# Patient Record
Sex: Female | Born: 1950 | Race: White | Hispanic: No | Marital: Married | Smoking: Never smoker
Health system: Southern US, Community
[De-identification: ages and names within clinical notes are randomized; demographics above are authoritative.]

## PROBLEM LIST (undated history)

## (undated) DIAGNOSIS — M1990 Unspecified osteoarthritis, unspecified site: Secondary | ICD-10-CM

## (undated) DIAGNOSIS — F419 Anxiety disorder, unspecified: Secondary | ICD-10-CM

## (undated) DIAGNOSIS — H04129 Dry eye syndrome of unspecified lacrimal gland: Secondary | ICD-10-CM

## (undated) DIAGNOSIS — K760 Fatty (change of) liver, not elsewhere classified: Secondary | ICD-10-CM

## (undated) DIAGNOSIS — E559 Vitamin D deficiency, unspecified: Secondary | ICD-10-CM

## (undated) DIAGNOSIS — F410 Panic disorder [episodic paroxysmal anxiety] without agoraphobia: Secondary | ICD-10-CM

## (undated) DIAGNOSIS — K649 Unspecified hemorrhoids: Secondary | ICD-10-CM

## (undated) DIAGNOSIS — I10 Essential (primary) hypertension: Secondary | ICD-10-CM

## (undated) DIAGNOSIS — M8580 Other specified disorders of bone density and structure, unspecified site: Secondary | ICD-10-CM

## (undated) DIAGNOSIS — J309 Allergic rhinitis, unspecified: Secondary | ICD-10-CM

## (undated) HISTORY — DX: Dry eye syndrome of unspecified lacrimal gland: H04.129

## (undated) HISTORY — DX: Unspecified osteoarthritis, unspecified site: M19.90

## (undated) HISTORY — DX: Fatty (change of) liver, not elsewhere classified: K76.0

## (undated) HISTORY — PX: COLONOSCOPY WITH ESOPHAGOGASTRODUODENOSCOPY (EGD): SHX5779

## (undated) HISTORY — DX: Allergic rhinitis, unspecified: J30.9

## (undated) HISTORY — DX: Unspecified hemorrhoids: K64.9

## (undated) HISTORY — DX: Vitamin D deficiency, unspecified: E55.9

## (undated) HISTORY — PX: ROTATOR CUFF REPAIR: SHX139

## (undated) HISTORY — PX: OTHER SURGICAL HISTORY: SHX169

## (undated) HISTORY — DX: Anxiety disorder, unspecified: F41.9

## (undated) HISTORY — PX: TONSILLECTOMY: SUR1361

---

## 1999-01-22 ENCOUNTER — Other Ambulatory Visit: Admission: RE | Admit: 1999-01-22 | Discharge: 1999-01-22 | Payer: Self-pay | Admitting: Obstetrics and Gynecology

## 2000-08-10 ENCOUNTER — Other Ambulatory Visit: Admission: RE | Admit: 2000-08-10 | Discharge: 2000-08-10 | Payer: Self-pay | Admitting: Obstetrics and Gynecology

## 2001-10-25 ENCOUNTER — Other Ambulatory Visit: Admission: RE | Admit: 2001-10-25 | Discharge: 2001-10-25 | Payer: Self-pay | Admitting: Obstetrics and Gynecology

## 2002-12-26 ENCOUNTER — Other Ambulatory Visit: Admission: RE | Admit: 2002-12-26 | Discharge: 2002-12-26 | Payer: Self-pay | Admitting: Obstetrics and Gynecology

## 2004-01-15 ENCOUNTER — Other Ambulatory Visit: Admission: RE | Admit: 2004-01-15 | Discharge: 2004-01-15 | Payer: Self-pay | Admitting: Obstetrics and Gynecology

## 2005-04-06 ENCOUNTER — Other Ambulatory Visit: Admission: RE | Admit: 2005-04-06 | Discharge: 2005-04-06 | Payer: Self-pay | Admitting: Obstetrics and Gynecology

## 2005-06-23 ENCOUNTER — Other Ambulatory Visit: Admission: RE | Admit: 2005-06-23 | Discharge: 2005-06-23 | Payer: Self-pay | Admitting: Obstetrics and Gynecology

## 2006-09-06 HISTORY — PX: CHOLECYSTECTOMY: SHX55

## 2006-09-06 HISTORY — PX: TOTAL ABDOMINAL HYSTERECTOMY W/ BILATERAL SALPINGOOPHORECTOMY: SHX83

## 2006-11-18 ENCOUNTER — Encounter: Admission: RE | Admit: 2006-11-18 | Discharge: 2006-11-18 | Payer: Self-pay | Admitting: Gastroenterology

## 2006-12-01 ENCOUNTER — Ambulatory Visit (HOSPITAL_COMMUNITY): Admission: RE | Admit: 2006-12-01 | Discharge: 2006-12-01 | Payer: Self-pay | Admitting: Gastroenterology

## 2006-12-30 ENCOUNTER — Ambulatory Visit (HOSPITAL_COMMUNITY): Admission: RE | Admit: 2006-12-30 | Discharge: 2006-12-31 | Payer: Self-pay | Admitting: Surgery

## 2006-12-30 ENCOUNTER — Encounter (INDEPENDENT_AMBULATORY_CARE_PROVIDER_SITE_OTHER): Payer: Self-pay | Admitting: Specialist

## 2007-02-09 ENCOUNTER — Ambulatory Visit (HOSPITAL_COMMUNITY): Admission: RE | Admit: 2007-02-09 | Discharge: 2007-02-09 | Payer: Self-pay | Admitting: Obstetrics and Gynecology

## 2007-05-03 ENCOUNTER — Inpatient Hospital Stay (HOSPITAL_COMMUNITY): Admission: RE | Admit: 2007-05-03 | Discharge: 2007-05-06 | Payer: Self-pay | Admitting: Obstetrics and Gynecology

## 2007-05-03 ENCOUNTER — Encounter (INDEPENDENT_AMBULATORY_CARE_PROVIDER_SITE_OTHER): Payer: Self-pay | Admitting: Obstetrics and Gynecology

## 2007-07-01 ENCOUNTER — Encounter: Payer: Self-pay | Admitting: Pulmonary Disease

## 2007-08-07 ENCOUNTER — Ambulatory Visit: Payer: Self-pay | Admitting: Pulmonary Disease

## 2007-08-09 ENCOUNTER — Ambulatory Visit: Payer: Self-pay | Admitting: Pulmonary Disease

## 2007-08-09 ENCOUNTER — Telehealth: Payer: Self-pay | Admitting: Pulmonary Disease

## 2007-08-14 ENCOUNTER — Telehealth: Payer: Self-pay | Admitting: Pulmonary Disease

## 2008-05-09 ENCOUNTER — Ambulatory Visit (HOSPITAL_COMMUNITY): Admission: RE | Admit: 2008-05-09 | Discharge: 2008-05-09 | Payer: Self-pay | Admitting: Family Medicine

## 2008-06-03 IMAGING — US US ABDOMEN COMPLETE
1 series · 14 of 25 positions shown · non-contrast
Comparison: none

CLINICAL DATA: Abdominal pain.  
 ABDOMEN ULTRASOUND:
TECHNIQUE: Complete abdominal ultrasound examination was performed including evaluation of the liver, gallbladder, bile ducts, pancreas, kidneys, spleen, IVC, and abdominal aorta.

[Series 1: us abdomen complete · 14 of 73 slices shown]
[im 1/73]
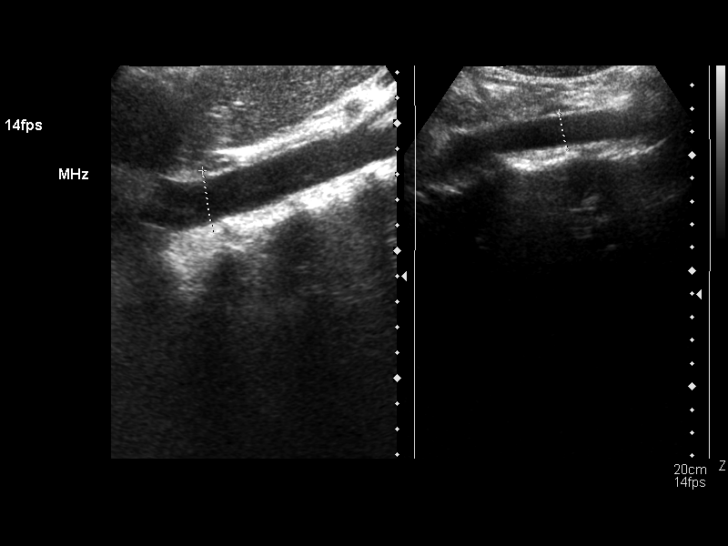
[im 7/73]
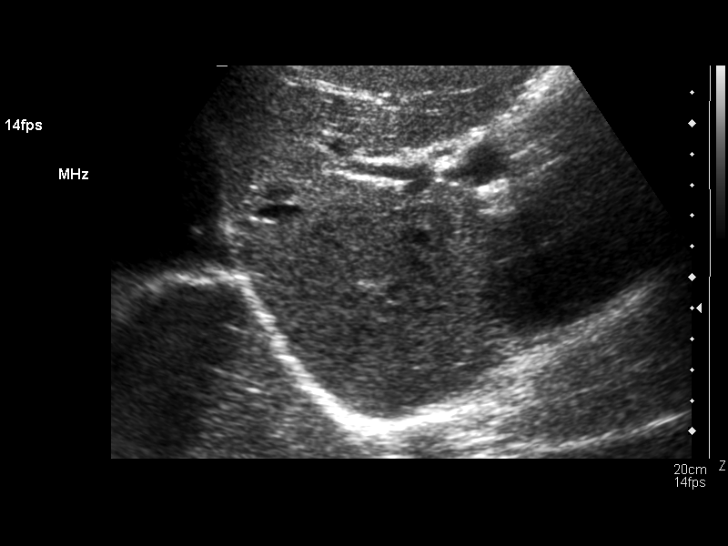
[im 13/73]
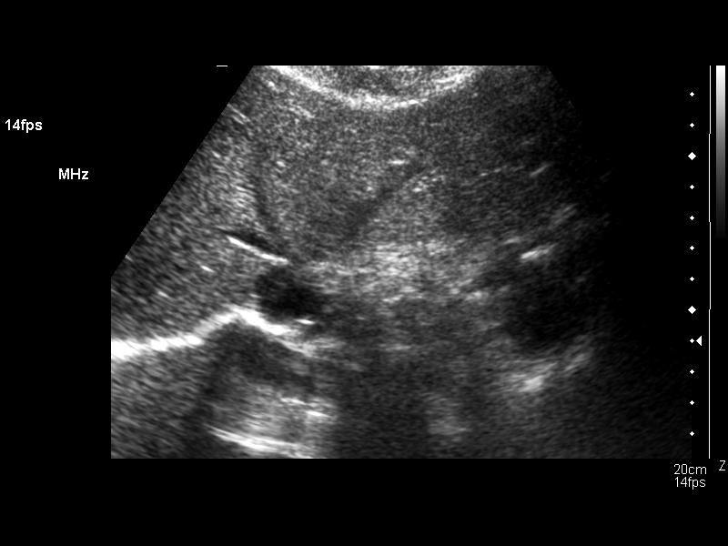
[im 19/73]
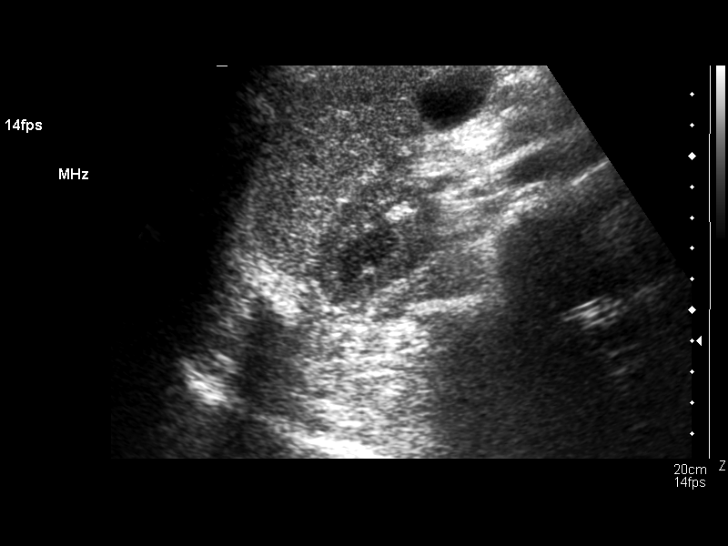
[im 25/73]
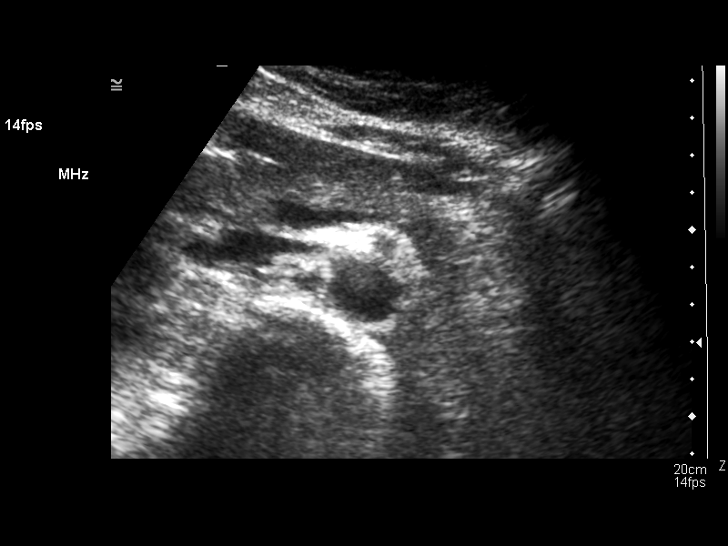
[im 28/73]
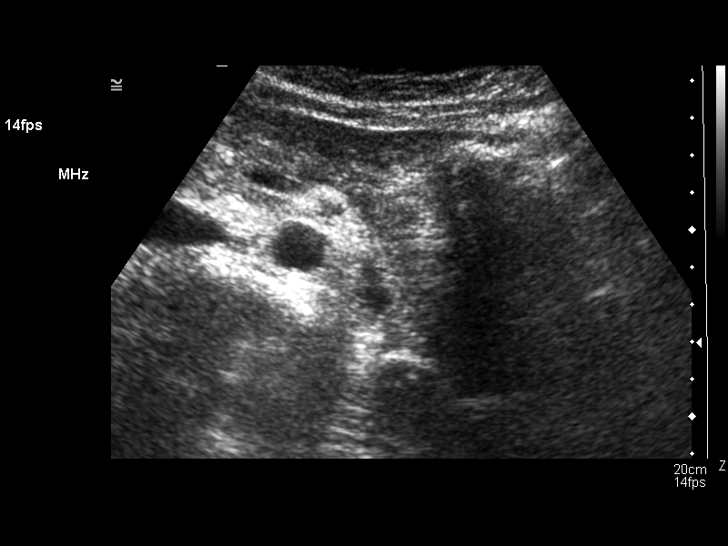
[im 34/73]
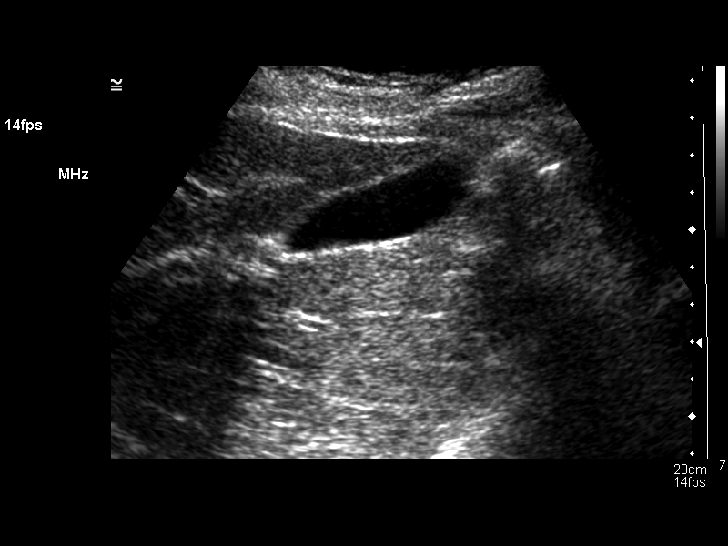
[im 40/73]
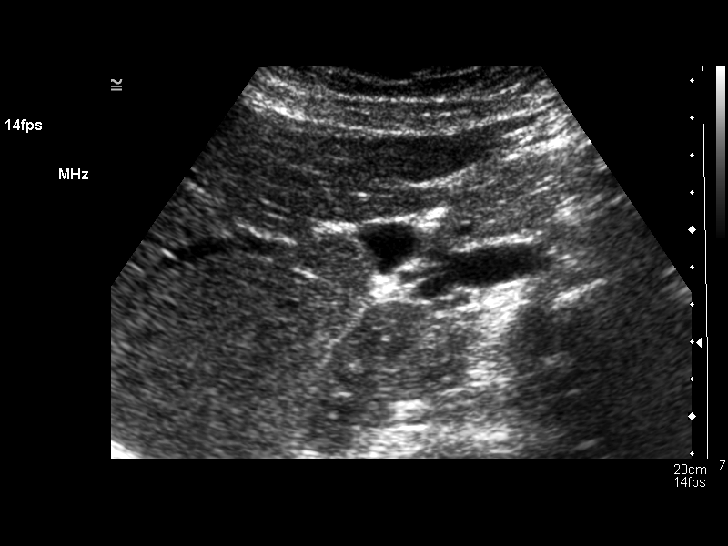
[im 46/73]
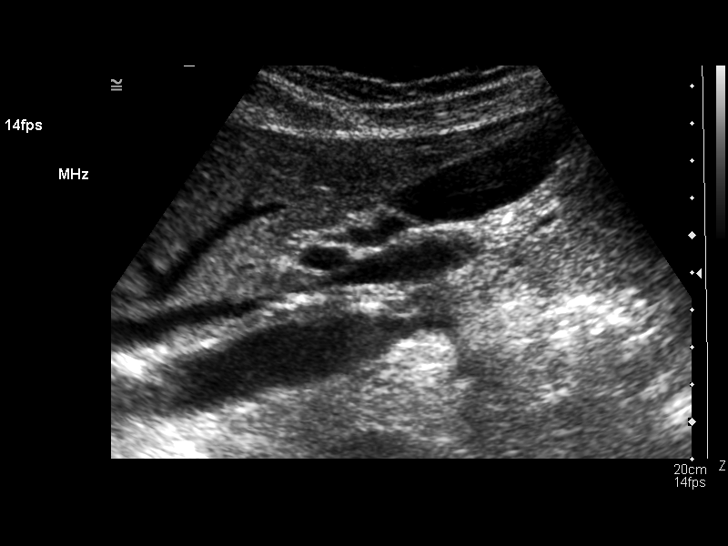
[im 49/73]
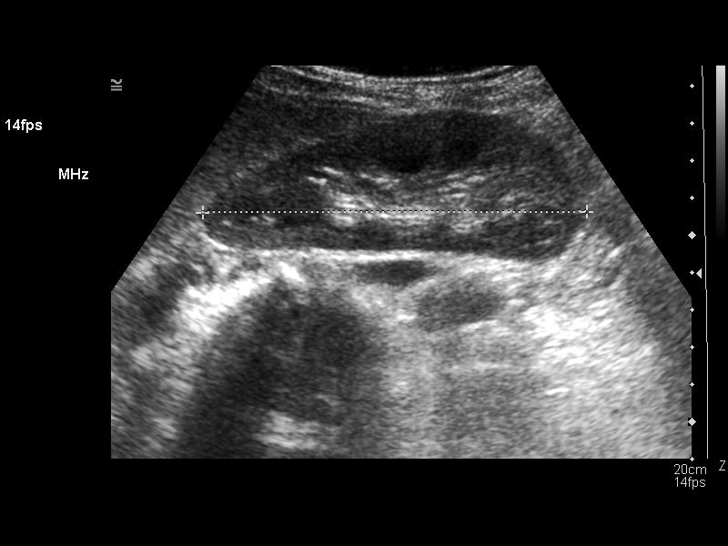
[im 55/73]
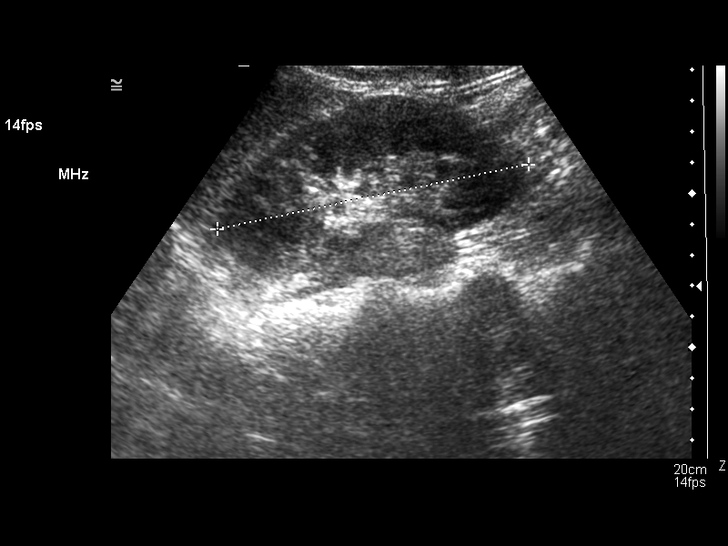
[im 61/73]
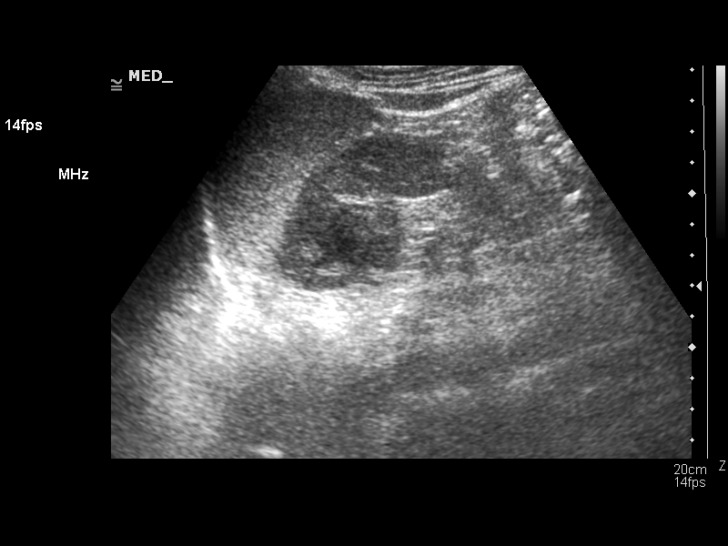
[im 67/73]
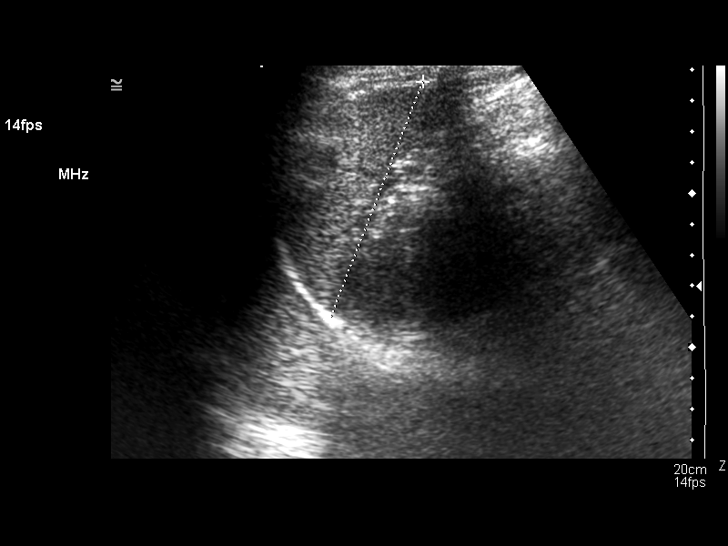
[im 73/73]
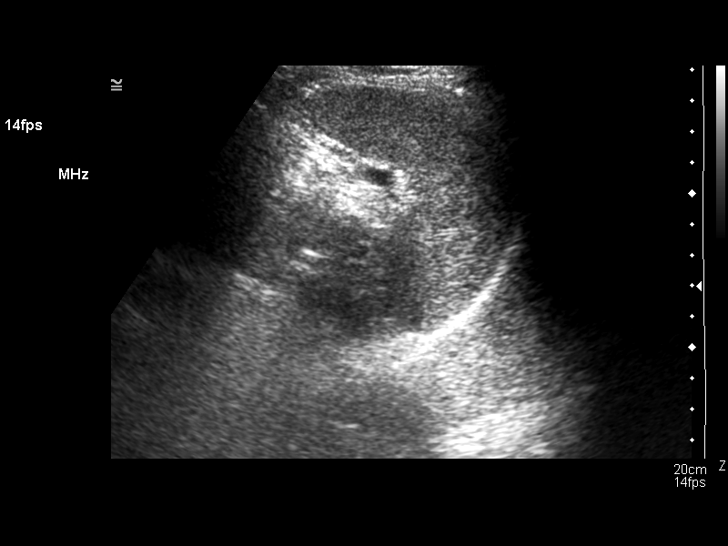

[14 of 25 positions shown; findings below may reference images not displayed]

FINDINGS: 4.7 mm echogenic focus posterior gallbladder wall, nonmobile, consistent with a small polyp.  No gallstones.   Common duct measures 2.7 mm which is well within normal limits.  No hepatic,  pancreatic, splenic, or renal abnormality.  The spleen measures 8.3 cm in length.  Right and left kidneys measure 10.4 cm and 10.5 cm in length, respectively.  Patent IVC.  Abdominal aorta maximum diameter is 2.5 cm.
IMPRESSION: Small gallbladder polyp.  Otherwise, negative ultrasound exam.

## 2009-05-30 ENCOUNTER — Ambulatory Visit (HOSPITAL_COMMUNITY): Admission: RE | Admit: 2009-05-30 | Discharge: 2009-05-30 | Payer: Self-pay | Admitting: Family Medicine

## 2010-07-06 ENCOUNTER — Ambulatory Visit (HOSPITAL_COMMUNITY): Admission: RE | Admit: 2010-07-06 | Discharge: 2010-07-06 | Payer: Self-pay | Admitting: Family Medicine

## 2010-09-27 ENCOUNTER — Encounter: Payer: Self-pay | Admitting: Gastroenterology

## 2011-01-19 NOTE — Op Note (Signed)
Jane Moody, Jane Moody              ACCOUNT NO.:  [id number]   MEDICAL RECORD NO.:  [id number]          PATIENT TYPE:  INP   LOCATION:  9307                          FACILITY:  [facility]   PHYSICIAN:  Guy Sandifer. Henderson Cloud, M.D. DATE OF BIRTH:  Oct 26, 1950   DATE OF PROCEDURE:  05/03/2007  DATE OF DISCHARGE:                               OPERATIVE REPORT   PREOPERATIVE DIAGNOSES:  1. Cervical dysplasia.  2. Agglutinated upper vagina.   POSTOPERATIVE DIAGNOSES:  1. Cervical dysplasia.  2. Agglutinated upper vagina.   PROCEDURE:  Total abdominal hysterectomy with bilateral salpingo-  oophorectomy.   SURGEON:  Guy Sandifer. Henderson Cloud, M.D.   ASSISTANTFreddy Finner, M.D.   ANESTHESIA:  General with endotracheal intubation.   SPECIMENS:  Uterus, bilateral tubes and ovaries to pathology.   ESTIMATED BLOOD LOSS:  100 mL.   INDICATIONS AND CONSENT:  This patient is a 60 year old married white  female, G2, P2, status post LEEP procedure of the cervix in December  2006.  Following that the upper vagina essentially agglutinated,  obscuring the cervix.  Attempts to reach this were successful.  Because  of the concern that the cervix could not be evaluated for recurrent  cervical dysplasia, a hysterectomy was recommended.  The patient also  desires bilateral salpingo-oophorectomy.  Potential risks and  complications are discussed preoperatively including but not limited to  infection; bowel, bladder, or ureteral damage; bleeding requiring  transfusion of blood products with possible transfusion reaction, HIV  and hepatitis acquisition;  DVT, PE, pneumonia, fistula formation,  postoperative dyspareunia and pelvic pain.  All questions were answered  and consent is signed on the chart.   FINDINGS:  Upper abdomen palpates grossly normal.  Uterus is normal  size.  Anterior and posterior cul-de-sacs are normal and tubes and  ovaries are normal bilaterally.   PROCEDURE:  The patient is taken to the  operating room, where she is  identified, placed in the dorsal supine position and general anesthesia  is induced via endotracheal intubation.  She is then prepped abdominally  and vaginally, Foley catheter is placed in the bladder to drain.  A  sponge on a stick is placed in the vagina and she is draped in a sterile  fashion.  The skin is entered through a Pfannenstiel incision and  dissection is carried out in layers to the peritoneum.  Peritoneum is  incised, extended superiorly and inferiorly.  The O'Connor-O'Sullivan  retractor is placed.  The bladder blade was placed and the bowel was  packed away and the upper blade was placed.  The retractor is elevated  with towels bilaterally to elevate the retaining arms and prevent any  pressure on the pelvic sidewalls.  The uterus is grasped with Kelly  clamps bilaterally.  The left round ligament is ligated with 0 Monocryl  and taken down.  All suture will be 0 Monocryl unless otherwise  designated.  Anterior leaf of the broad ligament is taken down as well  as the vesicouterine peritoneum.  The ureter is identified well clear of  the area of surgery.  Posterior leaf of the  broad ligament is sharply  perforated.  The infundibulopelvic ligament is clamped, taken down and  doubly ligated first with a free tie, then with a suture.  The uterine  artery is skeletonized and taken down and ligated in a single pedicle.  The cardinal ligament is taken down as well.  A similar procedure is  carried out on the right side.  After advancing the bladder, progressive  bites are taken down the cardinal ligament and then curved clamps are  used to enter the vagina bilaterally.  The specimen is then cut free  completely.  Inspection reveals the entire cervix to be removed.  Also,  inspection reveals this has opened the vagina as well.  The vagina is  closed with figure-of-eights.  The uterosacral ligaments are plicated in  the midline with a separate suture.   The angle sutures on either side  which have been held are also tied in the midline.  Irrigation is  carried out and careful inspection reveals good hemostasis all around.  Retractors and packs are removed.  The anterior peritoneum is closed in  running fashion with 0 Monocryl suture, which is also used to  reapproximate the pyramidalis muscle in the midline.  Anterior rectus  fascia is closed with 0 PDS and the skin is closed with clips.  The  sponge on a stick is removed from the vagina intact.  All counts are  correct.  The patient is awakened, taken to the recovery room in stable  condition.      Guy Sandifer Henderson Cloud, M.D.  Electronically Signed     JET/MEDQ  D:  05/03/2007  T:  05/03/2007  Job:  [id number]

## 2011-01-19 NOTE — Assessment & Plan Note (Signed)
[facility]                             PULMONARY OFFICE NOTE   Jane Moody, Jane Moody                     MRN:          [id number]  DATE:08/07/2007                            DOB:          25-May-1951    REFERRING PHYSICIAN:  Caryn Bee L. Little, M.D.   HISTORY OF PRESENT ILLNESS:  Patient is a very pleasant 60 year old  female, whom I have been asked to see for questionable emphysema.  The  patient states that she has had some gallbladder difficulties and  recently had a workup over at [facility], after which the doctors  told her that she had emphysema.  Apparently this was done on a chest  CT scan.  Patient was very concerned about this and wished to have  further followup.  The patient states that she has never smoked in her  life, but did have significant second-hand smoke exposure.  She does  state that she had frequent pulmonary infections, growing up, but no  history of asthma.  Currently, she is concerned about her exertional  tolerance and states that, at one block at a moderate pace, she will get  short of breath on flat ground.  She does not have any difficulties with  breathing with vacuuming or bringing groceries in from the car.  She  does have difficulties with stairs.  Patient has had no cough or mucus  production and there is no history of premature lung disease in the  family.  The patient has had recent weight-loss, due to her GI issues,  but now she is at a neutral weight.   PAST MEDICAL HISTORY:  1. Significant for hypertension.  2. History of allergic rhinitis.  3. Status post cholecystectomy.  4. Status post hysterectomy.   CURRENT MEDICATIONS INCLUDE:  1. Benicar/hydrochlorothiazide 12.5 one daily.  2. Actonel 150 q. Monday.  3. Pamelor 50 mg daily.  4. Klonopin 0.5 mg daily.   ALLERGIES:  The patient is allergic to ERYTHROMYCIN.   SOCIAL HISTORY:  She is married.  She has never smoked.  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   FAMILY HISTORY:  Remarkable for her father having allergies and her  mother having had heart disease.  Otherwise, noncontributory.   REVIEW OF SYSTEMS:  As per History of Present Illness.  Also see patient  intake form, documented on the chart.   PHYSICAL EXAM:  IN GENERAL:  She is a well-developed female, in no acute  distress.  Blood pressure is 98/68, pulse 74, temperature is 98.3, weight is 126  pounds, she is 5 feet 1 inch tall, O2 saturation on room air is 99%.  HEENT:  Pupils were equal, round and reactive to light and  accommodation.  Extraocular muscles are intact.  Nares are patent  without discharge.  Oropharynx is clear.  NECK:  Supple without JVD or lymphadenopathy.  There is no palpable  thyromegaly.  CHEST:  Totally clear to auscultation.  CARDIAC EXAM:  Reveals regular rate and rhythm.  ABDOMEN:  Soft, nontender, with good bowel sounds.  GENITAL EXAM, RECTAL  EXAM, BREAST EXAM:  Not done and not indicated.  LOWER EXTREMITIES:  Without edema.  Pulses are intact distally.  NEUROLOGICALLY:  Alert and oriented with no obvious observable motor  defects.   LABORATORY DATA:  I have reviewed a disc from [facility].  Unfortunately, there is no chest x-ray or chest CT on this.  There is  only a CT scan of the abdomen and a gastric emptying scan.  I did review  the CT of the abdomen, which does have some cuts through the lower one-  third of the lung bases.  This does not show any significant  emphysematous change.   IMPRESSION:  Questionable emphysema, noted on x-rays at [facility].  Unfortunately, these are not available to me at this time.  The patient really has no significant pulmonary symptoms, except for her  dyspnea on exertion, that may be just due to conditioning.  I have had a  long discussion with her about obstructive lung disease and how we made  the diagnosis.  I really think she needs to have full PFTs to  see  whether this is a real issue for her or not.  I would also like to get  her chest imaging from [facility] to also look at for her.   PLAN:  1. Schedule for full PFTs.  2. Will obtain chest imaging from [facility] for further      evaluation.  3. The patient will follow up after the above.     Barbaraann Share, MD,FCCP  Electronically Signed    [facility]/MedQ  DD: 08/08/2007  DT: 08/09/2007  Job #: [id number]

## 2011-01-19 NOTE — Discharge Summary (Signed)
NAMEDALILA, ARCA              ACCOUNT NO.:  [id number]   MEDICAL RECORD NO.:  [id number]          PATIENT TYPE:  INP   LOCATION:  9307                          FACILITY:  [facility]   PHYSICIAN:  Guy Sandifer. Henderson Cloud, M.D. 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   PROCEDURE:  On May 03, 2007, total abdominal hysterectomy with  bilateral salpingo-oophorectomy.   REASON FOR ADMISSION:  This patient is a 60 year old married white  female, G2 P2, with an agglutinated upper vagina.  Details are dictated  in the history and physical.  She is admitted for surgical management.   HOSPITAL COURSE:  The patient is admitted to the hospital, undergoes the  above procedure.  Estimated blood loss is 100 cc.  On the evening of  surgery, she has good pain relief, has clear urine output and has stable  vital signs and is afebrile.  On the first postoperative day, she is  tolerating regular diet, remains afebrile.  Hemoglobin is 10.7, and  pathology is pending.  On the evening of the May 04, 2007, she  complains of some achiness and has a temperature of 101.5.  Evaluation  included a white count of 13.3 which was a slight decrease, hemoglobin  of 11.6 and platelets of 246,000.  This was felt to be possibly due to  atelectasis.  Ambulation and incentive spirometry were encouraged.  The  following morning, the patient had just become afebrile.  She had a few  crackles at the left base that cleared with deep inspiration.  She was  observed  and on the day of discharge remains afebrile.  Vital signs are  stable.  She is tolerating regular diet and ambulating quite well.   CONDITION ON DISCHARGE:  Good.   DIET:  Regular, as tolerated.   ACTIVITY:   No lifting, no operation of automobiles, no vaginal entry.  She is to call the office for problems including but not limited to  temperature of 101 degrees, persistent nausea or vomiting, increasing  pain or heavy vaginal bleeding.   MEDICATION:  1. Dilaudid 2 mg, #21, 1 p.o. q.6 hours p.r.n.  2. Ibuprofen 600 mg q.6 hours p.r.n.  3. Multivitamin daily.  4. She is to resume her other preop medications.   FOLLOWUP:  Followup is in the office in 2 weeks.      Guy Sandifer Henderson Cloud, M.D.  Electronically Signed     JET/MEDQ  D:  05/06/2007  T:  05/07/2007  Job:  [id number]

## 2011-01-19 NOTE — H&P (Signed)
Jane Moody, Jane Moody              ACCOUNT NO.:  [id number]   MEDICAL RECORD NO.:  [id number]          PATIENT TYPE:  AMB   LOCATION:  SDC                           FACILITY:  [facility]   PHYSICIAN:  Guy Sandifer. Henderson Cloud, M.D. DATE OF BIRTH:  April 13, 1951   DATE OF ADMISSION:  05/03/2007  DATE OF DISCHARGE:                              HISTORY & PHYSICAL   CHIEF COMPLAINT:  Agglutinated vagina.   HISTORY OF PRESENT ILLNESS:  This patient is a 60 year old married white  female G2, P2, who is status post LEEP of the cervix on August 19, 2005.  Following that the upper vagina completely agglutinated together,  obscuring the cervix.  Attempts to gain access to the cervix have been  unsuccessful.  Pap smear on August 17, 2006, was benign.  On February 09, 2007, she was taken to the operating room and attempts at accessing the  cervix were unsuccessful secondary to the agglutinated upper vagina.  Because of the risk of recurrent cervical dysplasia that cannot be  evaluated, a total abdominal hysterectomy has been recommended.  The  patient also requests bilateral salpingo-oophorectomy.  Potential risks  and complications have been discussed preoperatively.   PAST MEDICAL HISTORY:  1. Depression.  2. Chronic hypertension.  3. History of panic attacks.   PAST SURGICAL HISTORY:  1. LEEP.  2. Examination under anesthesia as above.   OBSTETRIC HISTORY:  Vaginal delivery x2.   FAMILY HISTORY:  1. Diabetes in mother, maternal grandmother, maternal grandfather.  2. Breast cancer in maternal aunt.  3. Chronic hypertension in mother and father.  4. Heart disease in maternal grandmother.   MEDICATIONS:  Benicar, hydrochlorothiazide, Evista, Pamelor and  Klonopin.   DRUG ALLERGIES:  1. ERYTHROMYCIN LEADING TO STOMACH UPSET.  2. PERCOCET LEADING TO NAUSEA AND VOMITING.   SOCIAL HISTORY:  Denies tobacco, alcohol or drug abuse.   REVIEW OF SYSTEMS:  NEURO:  Denies headache.  CARDIAC:  Denies  chest  pain.  PULMONARY:  Denies shortness of breath.  GI:  Denies recent  change in bowel habits.   PHYSICAL EXAMINATION:  Height 5 feet 1 inch, blood pressure 90/60.  HEENT:  Without thyromegaly.  LUNGS:  Clear to auscultation.  HEART:  Regular rate and rhythm.  BACK:  Without CVA tenderness.  BREASTS:  Without mass __________ discharge.  ABDOMEN:  Soft, nontender, without masses.  PELVIC EXAM:  Vulva without lesion.  Vagina is completely agglutinated  at the top obscuring the cervix.  Uterus is normal in size to palpation.  Adnexa nontender, without masses.  EXTREMITIES:  Grossly within normal limits.  NEUROLOGICAL EXAM:  Grossly within normal limits.   ASSESSMENT:  History of cervical dysplasia with agglutinated upper  vagina.   PLAN:  Total abdominal hysterectomy with bilateral salpingo-  oophorectomy.      Guy Sandifer Henderson Cloud, M.D.  Electronically Signed     JET/MEDQ  D:  04/24/2007  T:  04/24/2007  Job:  [id number]

## 2011-01-22 NOTE — H&P (Signed)
Jane Moody, Jane Moody              ACCOUNT NO.:  [id number]   MEDICAL RECORD NO.:  [id number]          PATIENT TYPE:  AMB   LOCATION:  SDC                           [facility]ACILITY[facility]  [facility]   PHYSICIAN:  Guy Sandifer. Henderson Cloud, M.D. DATE OF BIRTH:  1951/01/05   DATE OF ADMISSION:  02/09/2007  DATE OF DISCHARGE:                              HISTORY & PHYSICAL   CHIEF COMPLAINT:  Rule out abnormal endometrial mass.   HISTORY OF PRESENT ILLNESS:  The patient is a 60 year old married white  female, G2, P2, who is status post LEEP of the cervix on August 19, 2005.  Following that, the upper vagina completely agglutinated  together, obscuring the cervix.  Pap smear on August 17, 2006, was  benign.  Attempts to colposcopically identify the endocervical canal  were unsuccessful.  Ultrasound in my office on September 26, 2006, noted  fluid within the endometrial cavity and the cervical canal, possibly  some small areas consistent with endometrial polyps.  After noting the  above, she is being admitted for an attempt at lysing the vaginal  adhesions and doing a Pap smear, hysteroscopy, and D&C.  Potential risks  and complications including the inability to open the vaginal adhesions  have been discussed with the patient preoperatively.   PAST MEDICAL HISTORY:  1. Depression.  2. Chronic hypertension.  3. History of panic attacks.   PAST SURGICAL HISTORY:  LEEP as above.   OBSTETRIC HISTORY:  Vaginal delivery x2.   FAMILY HISTORY:  Diabetes in mother, maternal grandmother, maternal  grandfather.  Breast cancer in maternal aunt.  Chronic hypertension in  mother and father.  Heart disease in maternal grandmother.   MEDICATIONS:  1. Evista which was discontinued 2 days preoperatively.  2. Benicar.  3. Nortriptyline.  4. Klonopin.   DRUG ALLERGIES:  ERYTHROMYCIN and PERCOCET, both leading to nausea and  vomiting.   SOCIAL HISTORY:  Denies tobacco, alcohol, or drug abuse.   REVIEW OF SYSTEMS:   NEUROLOGIC:  Denies headache.  CARDIAC:  Denies  chest pain.  PULMONARY:  Denies shortness of breath.  GI:  Denies recent  changes in bowel habits.   PHYSICAL EXAMINATION:  VITAL SIGNS:  Height 5 feet 1 inch, weight 121  pounds.  Blood pressure 104/80.  HEENT/NECK:  Without thyromegaly.  LUNGS:  Clear to auscultation.  HEART:  Regular rate and rhythm.  BACK:  Without CVA tenderness.  BREASTS:  Without mass, retraction, discharge.  ABDOMEN:  Soft, nontender, without masses.  PELVIC:  Vulva without lesion.  Vagina is completely agglutinated at the  top, unable to visualize the cervix.  Uterus is normal size to  palpation.  Adnexa nontender without masses.  EXTREMITIES:  Grossly within normal limits.  NEUROLOGIC:  Exam grossly within normal limits.   ASSESSMENT:  1. Agglutinated upper vagina.  2. History of  loop electrosurgical excision procedure with cervical      dysplasia.  3. Possible endometrial polyps.   PLAN:  1. Attempted lysis of vaginal adhesions.  2. Pap smear.  3. Hysteroscopy.  4. Dilatation and curettage.      Guy Sandifer  Henderson Cloud, M.D.  Electronically Signed     JET/MEDQ  D:  02/07/2007  T:  02/07/2007  Job:  [id number]

## 2011-01-22 NOTE — Op Note (Signed)
Jane Moody, Jane Moody NO.:  [id number]   MEDICAL RECORD NO.:  [id number]          PATIENT TYPE:  AMB   LOCATION:  DAY                          [facility]ACILITY:  [facility]   PHYSICIAN:  Currie Paris, M.D.DATE OF BIRTH:  26-May-1951   DATE OF PROCEDURE:  12/30/2006  DATE OF DISCHARGE:                               OPERATIVE REPORT   [id number]   PREOPERATIVE DIAGNOSIS:  Biliary dyskinesia.   POSTOPERATIVE DIAGNOSIS:  Biliary dyskinesia.   OPERATION:  Laparoscopic cholecystectomy with operative cholangiogram.   SURGEON:  Currie Paris, M.D.   ASSISTANT:  Adolph Pollack, M.D.   ANESTHESIA:  General endotracheal.   CLINICAL HISTORY:  The patient is a 60 year old lady with biliary type  symptoms and abnormal emptying scan but no gallstones seen on  ultrasound. After discussion with the patient she elected to proceed  with cholecystectomy.   DESCRIPTION OF PROCEDURE:  The patient was seen in the holding area and  she had no further questions.  She confirmed removal of the gallbladder  as the operative procedure.   The patient was taken to the operating room and after satisfactory  general anesthesia had been obtained, the abdomen was prepped and  draped.  A time out occurred.   I used 0.25% plain Marcaine for each incision.  The umbilical incision  was made, the fascia identified and the peritoneal cavity entered under  direct vision.  A pursestring was placed, the Hasson introduced and the  abdomen insufflated to 15.   A 10/11 trocar was placed under direct vision in the epigastrium and two  5 mm laterally.  I saw no gross abnormalities in the abdomen.  The  duodenum was noted to be tethered with adhesion about midway up the  small-bowel and I wondered if this might have contributed a little to  her nausea and some poor duodenal emptying.  It also suggested that she  may have been having some chronic inflammatory changes.   At any rate, these were  sharply taken down.  The gallbladder was  retracted over the liver.  I saw, what appeared to be  an anterior  branch of the artery coming well off early from the hepatic artery and  going up onto the gallbladder so I divided that with clips to get good  access to the triangle of Calot. I then opened the perineum and  dissected out the cystic duct for good length.  I saw a small posterior  branch which I clipped. I think clipped the gallbladder cystic duct  junction and opened the cystic duct.   A Cook catheter was introduced percutaneously and operative angiography  done.  We showed good filling of the duodenum and no filling defects and  filling up into the hepatic radicles.   The cystic duct catheter was removed and 3 clips placed on the stay side  of the cystic duct and it was divided.  I removed the gallbladder from  below to above.  There were a couple of other small vessels coming in  posteriorly almost directly from the liver and these were clipped close  to the gallbladder and divided.  There was very little of a peritoneal  base left behind and something of a raw surface on the liver at the  gallbladder bed but there was no significant bleeding.   Once the gallbladder had been disconnected, it was placed in a bag.  I  irrigated to make sure everything was dry.  I cauterized a few oozing  points on the bed of the gallbladder because there was a raw surface, I  left some Surgicel on the bed of the gallbladder but it was dry at the  time we placed the Surgicel.   The gallbladder was brought out the umbilical port without difficulty.  The abdomen was reinsufflated and final irrigation, a check for  hemostasis was done and there had been no bleeding.  The lateral ports  were removed under direct vision.  The umbilical port was closed with  the pursestring.  The abdomen was desufflated through the epigastric  port.  Skin was closed with 4-0 Monocryl subcuticular plus Dermabond.    The patient tolerated the procedure well.  There were no operative  complications. All counts were correct.      Currie Paris, M.D.  Electronically Signed     CJS/MEDQ  D:  12/30/2006  T:  12/30/2006  Job:  [id number]   cc:   Caryn Bee L. Little, M.D.  James L. Malon Kindle., M.D.  Guy Sandifer Henderson Cloud, M.D.

## 2011-01-22 NOTE — Op Note (Signed)
Jane Moody, Jane Moody              ACCOUNT NO.:  [id number]   MEDICAL RECORD NO.:  [id number]          PATIENT TYPE:  AMB   LOCATION:  SDC                           FACILITY:  [facility]   PHYSICIAN:  Guy Sandifer. Henderson Cloud, M.D. DATE OF BIRTH:  [birth year]   DATE OF PROCEDURE:  02/09/2007  DATE OF DISCHARGE:                               OPERATIVE REPORT   PREOPERATIVE DIAGNOSIS:  Agglutinated vagina.   POSTOPERATIVE DIAGNOSIS:  Agglutinated vagina.   PROCEDURE:  Examination under anesthesia.   SURGEON:  Harold Hedge, M.D.   ANESTHESIA:  MAC.   ESTIMATED BLOOD LOSS:  None.   INDICATIONS AND CONSENT:  The patient is a 60 year old married white  female, G2, P2, who is status post LEEP of cervix in 2006.  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  Attempts at opening this in the office under colposcopy have  been unsuccessful.  Pelvic ultrasound noted.  Some fluid within the  endometrial cavity and possibly some endometrial polyps.  Examination  under anesthesia with possible hysteroscopy, D&C, Pap smear and  hysteroscopy have been discussed preoperatively.  Potential risks and  complications were discussed preoperatively including but not limited to  infection, organ damage, bleeding requiring transfusion of blood  products with possible transfusion reaction - HIV and hepatitis  acquisition, DVT, PE and pneumonia.  All questions were answered, and  consent is signed on the chart.   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   PROCEDURE:  The patient is taken to operating room where she is  identified, placed in dorsal supine position and anesthesia is induced.  She is then placed in dorsal lithotomy position where she is prepped and  draped in a sterile fashion.  Examination reveals a normal size mobile  uterus.  Speculum exam reveals the above findings.  Attempts at digital  dissection as well as gentle attempts at dissection by spreading  scissors are unsuccessful.  Because of the absence of landmarks and what  is felt to be increased possibility of entering the bladder or rectum,  the procedure is then terminated.  All counts correct.  The patient is  awakened and taken to the recovery room in stable condition.      Guy Sandifer Henderson Cloud, M.D.  Electronically Signed     JET/MEDQ  D:  02/09/2007  T:  02/09/2007  Job:  [id number]

## 2011-06-18 LAB — CBC
HCT: 33.5 — ABNORMAL LOW
Hemoglobin: 11.6 — ABNORMAL LOW
MCHC: 34.7
MCHC: 34.8
MCV: 89.2
RBC: 3.71 — ABNORMAL LOW
RBC: 4.3
RDW: 13
RDW: 13
WBC: 14.4 — ABNORMAL HIGH
WBC: 9.3

## 2011-06-18 LAB — COMPREHENSIVE METABOLIC PANEL
ALT: 46 — ABNORMAL HIGH
AST: 44 — ABNORMAL HIGH
Albumin: 2.4 — ABNORMAL LOW
Albumin: 3.5
Alkaline Phosphatase: 62
BUN: 16
BUN: 5 — ABNORMAL LOW
CO2: 31
Chloride: 106
Creatinine, Ser: 0.78
GFR calc Af Amer: >60
Glucose, Bld: 105 — ABNORMAL HIGH
Glucose, Bld: 174 — ABNORMAL HIGH
Sodium: 141
Total Bilirubin: 0.6
Total Protein: 6.4

## 2011-06-18 LAB — URINALYSIS, ROUTINE W REFLEX MICROSCOPIC
Glucose, UA: NEGATIVE
Ketones, ur: NEGATIVE
Leukocytes, UA: NEGATIVE
Protein, ur: NEGATIVE
Specific Gravity, Urine: 1.01

## 2011-06-18 LAB — URINE MICROSCOPIC-ADD ON

## 2011-06-24 LAB — BASIC METABOLIC PANEL
CO2: 32
Calcium: 9.6
Creatinine, Ser: 0.75
Potassium: 3.7
Sodium: 140

## 2011-06-24 LAB — CBC
HCT: 37.9
Hemoglobin: 12.8
MCHC: 33.9
MCV: 89.8
WBC: 7.3

## 2011-07-10 ENCOUNTER — Other Ambulatory Visit (HOSPITAL_COMMUNITY): Payer: Self-pay | Admitting: *Deleted

## 2011-07-12 ENCOUNTER — Ambulatory Visit (HOSPITAL_COMMUNITY)
Admission: RE | Admit: 2011-07-12 | Discharge: 2011-07-12 | Disposition: A | Discharge status: Home / Self Care | Payer: 59 | Source: Ambulatory Visit | Attending: Family Medicine | Admitting: Family Medicine

## 2011-07-12 DIAGNOSIS — M810 Age-related osteoporosis without current pathological fracture: Secondary | ICD-10-CM | POA: Insufficient documentation

## 2011-07-12 MED ORDER — ZOLEDRONIC ACID 5 MG/100ML IV SOLN
5.0000 mg | Freq: Once | INTRAVENOUS | Status: AC
  Administered 2011-07-12: 5 mg via INTRAVENOUS
  Filled 2011-07-12: qty 100

## 2011-07-12 MED ORDER — SODIUM CHLORIDE 0.9 % IV SOLN
INTRAVENOUS | Status: DC
  Administered 2011-07-12: 15:00:00 via INTRAVENOUS

## 2012-08-10 ENCOUNTER — Other Ambulatory Visit (HOSPITAL_COMMUNITY): Payer: Self-pay | Admitting: Family Medicine

## 2012-08-11 ENCOUNTER — Other Ambulatory Visit (HOSPITAL_COMMUNITY): Payer: Self-pay | Admitting: Family Medicine

## 2012-08-14 ENCOUNTER — Encounter (HOSPITAL_COMMUNITY): Payer: 59

## 2012-08-15 ENCOUNTER — Encounter (HOSPITAL_COMMUNITY): Payer: Self-pay

## 2012-08-15 ENCOUNTER — Encounter (HOSPITAL_COMMUNITY)
Admission: RE | Admit: 2012-08-15 | Discharge: 2012-08-15 | Disposition: A | Discharge status: Home / Self Care | Payer: 59 | Source: Ambulatory Visit | Attending: Family Medicine | Admitting: Family Medicine

## 2012-08-15 DIAGNOSIS — M810 Age-related osteoporosis without current pathological fracture: Secondary | ICD-10-CM | POA: Insufficient documentation

## 2012-08-15 HISTORY — DX: Unspecified osteoarthritis, unspecified site: M19.90

## 2012-08-15 HISTORY — DX: Essential (primary) hypertension: I10

## 2012-08-15 HISTORY — DX: Other specified disorders of bone density and structure, unspecified site: M85.80

## 2012-08-15 HISTORY — DX: Panic disorder (episodic paroxysmal anxiety): F41.0

## 2012-08-15 MED ORDER — ZOLEDRONIC ACID 5 MG/100ML IV SOLN
5.0000 mg | Freq: Once | INTRAVENOUS | Status: AC
  Administered 2012-08-15: 5 mg via INTRAVENOUS
  Filled 2012-08-15: qty 100

## 2012-08-15 MED ORDER — SODIUM CHLORIDE 0.9 % IV SOLN
Freq: Once | INTRAVENOUS | Status: AC
  Administered 2012-08-15: 14:00:00 via INTRAVENOUS

## 2013-08-21 ENCOUNTER — Other Ambulatory Visit (HOSPITAL_COMMUNITY): Payer: Self-pay | Admitting: Family Medicine

## 2013-08-21 ENCOUNTER — Ambulatory Visit (HOSPITAL_COMMUNITY): Admission: RE | Admit: 2013-08-21 | Payer: 59 | Source: Ambulatory Visit

## 2013-09-07 ENCOUNTER — Ambulatory Visit (HOSPITAL_COMMUNITY)
Admission: RE | Admit: 2013-09-07 | Discharge: 2013-09-07 | Disposition: A | Discharge status: Home / Self Care | Payer: 59 | Source: Ambulatory Visit | Attending: Family Medicine | Admitting: Family Medicine

## 2013-09-07 ENCOUNTER — Encounter (HOSPITAL_COMMUNITY): Payer: Self-pay

## 2013-09-07 DIAGNOSIS — M810 Age-related osteoporosis without current pathological fracture: Secondary | ICD-10-CM | POA: Insufficient documentation

## 2013-09-07 MED ORDER — SODIUM CHLORIDE 0.9 % IV SOLN
Freq: Once | INTRAVENOUS | Status: AC
  Administered 2013-09-07: 16:00:00 via INTRAVENOUS

## 2013-09-07 MED ORDER — ZOLEDRONIC ACID 5 MG/100ML IV SOLN
5.0000 mg | Freq: Once | INTRAVENOUS | Status: AC
  Administered 2013-09-07: 5 mg via INTRAVENOUS
  Filled 2013-09-07: qty 100

## 2013-09-07 NOTE — Discharge Instructions (Signed)
RECLAST °Zoledronic Acid injection (Paget's Disease, Osteoporosis) °What is this medicine? °ZOLEDRONIC ACID (ZOE le dron ik AS id) lowers the amount of calcium loss from bone. It is used to treat Paget's disease and osteoporosis in women. °This medicine may be used for other purposes; ask your health care provider or pharmacist if you have questions. °COMMON BRAND NAME(S): Reclast, Zometa °What should I tell my health care provider before I take this medicine? °They need to know if you have any of these conditions: °-aspirin-sensitive asthma °-cancer, especially if you are receiving medicines used to treat cancer °-dental disease or wear dentures °-infection °-kidney disease °-low levels of calcium in the blood °-past surgery on the parathyroid gland or intestines °-receiving corticosteroids like dexamethasone or prednisone °-an unusual or allergic reaction to zoledronic acid, other medicines, foods, dyes, or preservatives °-pregnant or trying to get pregnant °-breast-feeding °How should I use this medicine? °This medicine is for infusion into a vein. It is given by a health care professional in a hospital or clinic setting. °Talk to your pediatrician regarding the use of this medicine in children. This medicine is not approved for use in children. °Overdosage: If you think you have taken too much of this medicine contact a poison control center or emergency room at once. °NOTE: This medicine is only for you. Do not share this medicine with others. °What if I miss a dose? °It is important not to miss your dose. Call your doctor or health care professional if you are unable to keep an appointment. °What may interact with this medicine? °-certain antibiotics given by injection °-NSAIDs, medicines for pain and inflammation, like ibuprofen or naproxen °-some diuretics like bumetanide, furosemide °-teriparatide °This list may not describe all possible interactions. Give your health care provider a list of all the  medicines, herbs, non-prescription drugs, or dietary supplements you use. Also tell them if you smoke, drink alcohol, or use illegal drugs. Some items may interact with your medicine. °What should I watch for while using this medicine? °Visit your doctor or health care professional for regular checkups. It may be some time before you see the benefit from this medicine. Do not stop taking your medicine unless your doctor tells you to. Your doctor may order blood tests or other tests to see how you are doing. °Women should inform their doctor if they wish to become pregnant or think they might be pregnant. There is a potential for serious side effects to an unborn child. Talk to your health care professional or pharmacist for more information. °You should make sure that you get enough calcium and vitamin D while you are taking this medicine. Discuss the foods you eat and the vitamins you take with your health care professional. °Some people who take this medicine have severe bone, joint, and/or muscle pain. This medicine may also increase your risk for jaw problems or a broken thigh bone. Tell your doctor right away if you have severe pain in your jaw, bones, joints, or muscles. Tell your doctor if you have any pain that does not go away or that gets worse. °Tell your dentist and dental surgeon that you are taking this medicine. You should not have major dental surgery while on this medicine. See your dentist to have a dental exam and fix any dental problems before starting this medicine. Take good care of your teeth while on this medicine. Make sure you see your dentist for regular follow-up appointments. °What side effects may I notice from receiving this   medicine? °Side effects that you should report to your doctor or health care professional as soon as possible: °-allergic reactions like skin rash, itching or hives, swelling of the face, lips, or tongue °-anxiety, confusion, or depression °-breathing  problems °-changes in vision °-eye pain °-feeling faint or lightheaded, falls °-jaw pain, especially after dental work °-mouth sores °-muscle cramps, stiffness, or weakness °-trouble passing urine or change in the amount of urine °Side effects that usually do not require medical attention (report to your doctor or health care professional if they continue or are bothersome): °-bone, joint, or muscle pain °-constipation °-diarrhea °-fever °-hair loss °-irritation at site where injected °-loss of appetite °-nausea, vomiting °-stomach upset °-trouble sleeping °-trouble swallowing °-weak or tired °This list may not describe all possible side effects. Call your doctor for medical advice about side effects. You may report side effects to FDA at 1-800-FDA-1088. °Where should I keep my medicine? °This drug is given in a hospital or clinic and will not be stored at home. °NOTE: This sheet is a summary. It may not cover all possible information. If you have questions about this medicine, talk to your doctor, pharmacist, or health care provider. °© 2014, [employer]. (2013-02-05 10:03:48) °Osteoporosis °Throughout your life, your body breaks down old bone and replaces it with new bone. As you get older, your body does not replace bone as quickly as it breaks it down. By the age of 30 years, most people begin to gradually lose bone because of the imbalance between bone loss and replacement. Some people lose more bone than others. Bone loss beyond a specified normal degree is considered osteoporosis.  °Osteoporosis affects the strength and durability of your bones. The inside of the ends of your bones and your flat bones, like the bones of your pelvis, look like honeycomb, filled with tiny open spaces. As bone loss occurs, your bones become less dense. This means that the open spaces inside your bones become bigger and the walls between these spaces become thinner. This makes your bones weaker. Bones of a person with  osteoporosis can become so weak that they can break (fracture) during minor accidents, such as a simple fall. °CAUSES  °The following factors have been associated with the development of osteoporosis: °· Smoking. °· Drinking more than 2 alcoholic drinks several days per week. °· Long-term use of certain medicines: °· Corticosteroids. °· Chemotherapy medicines. °· Thyroid medicines. °· Antiepileptic medicines. °· Gonadal hormone suppression medicine. °· Immunosuppression medicine. °· Being underweight. °· Lack of physical activity. °· Lack of exposure to the sun. This can lead to vitamin D deficiency. °· Certain medical conditions: °· Certain inflammatory bowel diseases, such as Crohn disease and ulcerative colitis. °· Diabetes. °· Hyperthyroidism. °· Hyperparathyroidism. °RISK FACTORS °Anyone can develop osteoporosis. However, the following factors can increase your risk of developing osteoporosis: °· Gender Women are at higher risk than men. °· Age Being older than 50 years increases your risk. °· Ethnicity White and Asian people have an increased risk. °· Weight Being extremely underweight can increase your risk of osteoporosis. °· Family history of osteoporosis Having a family member who has developed osteoporosis can increase your risk. °SYMPTOMS  °Usually, people with osteoporosis have no symptoms.  °DIAGNOSIS  °Signs during a physical exam that may prompt your caregiver to suspect osteoporosis include: °· Decreased height. This is usually caused by the compression of the bones that form your spine (vertebrae) because they have weakened and become fractured. °· A curving or rounding of   the upper back (kyphosis). °To confirm signs of osteoporosis, your caregiver may request a procedure that uses 2 low-dose X-ray beams with different levels of energy to measure your bone mineral density (dual-energy X-ray absorptiometry [DXA]). Also, your caregiver may check your level of vitamin D. °TREATMENT  °The goal of  osteoporosis treatment is to strengthen bones in order to decrease the risk of bone fractures. There are different types of medicines available to help achieve this goal. Some of these medicines work by slowing the processes of bone loss. Some medicines work by increasing bone density. Treatment also involves making sure that your levels of calcium and vitamin D are adequate. °PREVENTION  °There are things you can do to help prevent osteoporosis. Adequate intake of calcium and vitamin D can help you achieve optimal bone mineral density. Regular exercise can also help, especially resistance and weight-bearing activities. If you smoke, quitting smoking is an important part of osteoporosis prevention. °MAKE SURE YOU: °· Understand these instructions. °· Will watch your condition. °· Will get help right away if you are not doing well or get worse. °FOR MORE INFORMATION °www.osteo.org and www.nof.org °Document Released: 06/02/2005 Document Revised: 12/18/2012 Document Reviewed: 08/07/2011 °[employer]® Patient Information ©2014 [employer]. ° ° °

## 2014-01-08 ENCOUNTER — Other Ambulatory Visit: Payer: Self-pay | Admitting: Dermatology

## 2014-09-05 ENCOUNTER — Other Ambulatory Visit: Payer: Self-pay | Admitting: Family Medicine

## 2014-09-05 ENCOUNTER — Ambulatory Visit
Admission: RE | Admit: 2014-09-05 | Discharge: 2014-09-05 | Disposition: A | Discharge status: Home / Self Care | Payer: 59 | Source: Ambulatory Visit | Attending: Family Medicine | Admitting: Family Medicine

## 2014-09-05 DIAGNOSIS — R0781 Pleurodynia: Secondary | ICD-10-CM

## 2014-09-09 ENCOUNTER — Encounter (HOSPITAL_COMMUNITY): Payer: Self-pay

## 2014-09-09 ENCOUNTER — Ambulatory Visit (HOSPITAL_COMMUNITY)
Admission: RE | Admit: 2014-09-09 | Discharge: 2014-09-09 | Disposition: A | Discharge status: Home / Self Care | Payer: 59 | Source: Ambulatory Visit | Attending: Family Medicine | Admitting: Family Medicine

## 2014-09-09 DIAGNOSIS — M810 Age-related osteoporosis without current pathological fracture: Secondary | ICD-10-CM | POA: Diagnosis not present

## 2014-09-09 MED ORDER — ZOLEDRONIC ACID 5 MG/100ML IV SOLN
5.0000 mg | Freq: Once | INTRAVENOUS | Status: AC
  Administered 2014-09-09: 5 mg via INTRAVENOUS
  Filled 2014-09-09: qty 100

## 2014-09-09 MED ORDER — SODIUM CHLORIDE 0.9 % IV SOLN
INTRAVENOUS | Status: DC
  Administered 2014-09-09: 13:00:00 via INTRAVENOUS

## 2014-09-09 NOTE — Discharge Instructions (Signed)

## 2015-06-10 ENCOUNTER — Other Ambulatory Visit (HOSPITAL_BASED_OUTPATIENT_CLINIC_OR_DEPARTMENT_OTHER): Payer: Self-pay | Admitting: Family Medicine

## 2015-06-10 ENCOUNTER — Ambulatory Visit (HOSPITAL_BASED_OUTPATIENT_CLINIC_OR_DEPARTMENT_OTHER)
Admission: RE | Admit: 2015-06-10 | Discharge: 2015-06-10 | Disposition: A | Discharge status: Home / Self Care | Payer: 59 | Source: Ambulatory Visit | Attending: Family Medicine | Admitting: Family Medicine

## 2015-06-10 DIAGNOSIS — I809 Phlebitis and thrombophlebitis of unspecified site: Secondary | ICD-10-CM

## 2015-06-10 DIAGNOSIS — M79661 Pain in right lower leg: Secondary | ICD-10-CM | POA: Insufficient documentation

## 2015-08-13 ENCOUNTER — Emergency Department (HOSPITAL_COMMUNITY): Payer: 59

## 2015-08-13 ENCOUNTER — Observation Stay (HOSPITAL_COMMUNITY)
Admission: EM | Admit: 2015-08-13 | Discharge: 2015-08-13 | Discharge status: Against Medical Advice | Payer: 59 | Attending: Emergency Medicine | Admitting: Emergency Medicine

## 2015-08-13 ENCOUNTER — Encounter (HOSPITAL_COMMUNITY): Payer: Self-pay | Admitting: Emergency Medicine

## 2015-08-13 DIAGNOSIS — I341 Nonrheumatic mitral (valve) prolapse: Secondary | ICD-10-CM | POA: Insufficient documentation

## 2015-08-13 DIAGNOSIS — R110 Nausea: Secondary | ICD-10-CM | POA: Diagnosis not present

## 2015-08-13 DIAGNOSIS — Z881 Allergy status to other antibiotic agents status: Secondary | ICD-10-CM | POA: Insufficient documentation

## 2015-08-13 DIAGNOSIS — Z8249 Family history of ischemic heart disease and other diseases of the circulatory system: Secondary | ICD-10-CM | POA: Insufficient documentation

## 2015-08-13 DIAGNOSIS — R079 Chest pain, unspecified: Principal | ICD-10-CM | POA: Diagnosis present

## 2015-08-13 DIAGNOSIS — I10 Essential (primary) hypertension: Secondary | ICD-10-CM | POA: Diagnosis not present

## 2015-08-13 DIAGNOSIS — Z79899 Other long term (current) drug therapy: Secondary | ICD-10-CM | POA: Insufficient documentation

## 2015-08-13 DIAGNOSIS — F419 Anxiety disorder, unspecified: Secondary | ICD-10-CM | POA: Diagnosis not present

## 2015-08-13 LAB — BASIC METABOLIC PANEL
ANION GAP: 9 (ref 5–15)
BUN: 11 mg/dL (ref 6–20)
CALCIUM: 9.7 mg/dL (ref 8.9–10.3)
CO2: 26 mmol/L (ref 22–32)
Chloride: 103 mmol/L (ref 101–111)
Creatinine, Ser: 0.87 mg/dL (ref 0.44–1.00)
GFR calc Af Amer: >60 mL/min (ref >60)
GLUCOSE: 164 mg/dL — AB (ref 65–99)
Potassium: 3.1 mmol/L — ABNORMAL LOW (ref 3.5–5.1)
Sodium: 138 mmol/L (ref 135–145)

## 2015-08-13 LAB — CBC
HEMATOCRIT: 41.2 % (ref 36.0–46.0)
HEMOGLOBIN: 13.9 g/dL (ref 12.0–15.0)
MCH: 29.7 pg (ref 26.0–34.0)
MCHC: 33.7 g/dL (ref 30.0–36.0)
MCV: 88 fL (ref 78.0–100.0)
Platelets: 274 10*3/uL (ref 150–400)
RBC: 4.68 MIL/uL (ref 3.87–5.11)
RDW: 13.2 % (ref 11.5–15.5)
WBC: 6.7 10*3/uL (ref 4.0–10.5)

## 2015-08-13 LAB — I-STAT TROPONIN, ED: TROPONIN I, POC: 0 ng/mL (ref 0.00–0.08)

## 2015-08-13 MED ORDER — ASPIRIN EC 325 MG PO TBEC
325.0000 mg | DELAYED_RELEASE_TABLET | Freq: Once | ORAL | Status: AC
  Administered 2015-08-13: 325 mg via ORAL
  Filled 2015-08-13: qty 1

## 2015-08-13 NOTE — ED Provider Notes (Signed)
CSN: [id number]     Arrival date & time 08/13/15  1322 History   First MD Initiated Contact with Patient 08/13/15 1840     Chief Complaint  Patient presents with  . Chest Pain     (Consider location/radiation/quality/duration/timing/severity/associated sxs/prior Treatment) Patient is a 64 y.o. female presenting with chest pain. The history is provided by the patient.  Chest Pain Pain location:  L chest Pain radiates to:  Neck Pain severity:  Moderate Onset quality:  Sudden Timing:  Intermittent Progression:  Resolved Chronicity:  New Relieved by:  Nothing Associated symptoms: no abdominal pain, no anxiety, no back pain, no dizziness, no fever, no headache, no lower extremity edema, no nausea, no palpitations, no shortness of breath, not vomiting and no weakness   Risk factors: hypertension   Risk factors: no coronary artery disease, no diabetes mellitus and no smoking     Past Medical History  Diagnosis Date  . Hypertension   . Panic attacks   . Arthritis   . Osteopenia    Past Surgical History  Procedure Laterality Date  . Cholecystectomy  2008  . Total abdominal hysterectomy w/ bilateral salpingoophorectomy  2008   No family history on file. Social History  Substance Use Topics  . Smoking status: Never Smoker   . Smokeless tobacco: None  . Alcohol Use: No   OB History    No data available     Review of Systems  Constitutional: Negative for fever.  HENT: Negative for rhinorrhea and sore throat.   Eyes: Negative for visual disturbance.  Respiratory: Negative for chest tightness and shortness of breath.   Cardiovascular: Positive for chest pain. Negative for palpitations.  Gastrointestinal: Negative for nausea, vomiting, abdominal pain and constipation.  Genitourinary: Negative for dysuria and hematuria.  Musculoskeletal: Negative for back pain and neck pain.  Skin: Negative for rash.  Neurological: Negative for dizziness, weakness and headaches.   Psychiatric/Behavioral: Negative for confusion.  All other systems reviewed and are negative.     Allergies  Erythromycin  Home Medications   Prior to Admission medications   Medication Sig Start Date End Date Taking? Authorizing Provider  aspirin 81 MG chewable tablet Chew 81 mg by mouth daily.     Yes Historical Provider, MD  benazepril-hydrochlorthiazide (LOTENSIN HCT) 20-12.5 MG tablet Take 1 tablet by mouth daily.   Yes Historical Provider, MD  Calcium Citrate (CALCITRATE PO) Take 1 tablet by mouth daily.   Yes Historical Provider, MD  clonazePAM (KLONOPIN) 0.5 MG tablet Take 0.5 mg by mouth at bedtime as needed.     Yes Historical Provider, MD  nortriptyline (PAMELOR) 50 MG capsule Take 50 mg by mouth at bedtime.     Yes Historical Provider, MD  calcium-vitamin D (OSCAL WITH D) 500-200 MG-UNIT per tablet Take 1 tablet by mouth 3 (three) times daily.      Historical Provider, MD  olmesartan-hydrochlorothiazide (BENICAR HCT) 20-12.5 MG per tablet Take 1 tablet by mouth daily.      Historical Provider, MD   BP 125/90 mmHg  Pulse 70  Temp(Src) 98 F (36.7 C) (Oral)  Resp 18  Ht  (1.549 m)  Wt 49.896 kg  BMI 20.80 kg/m2  SpO2 100% Physical Exam  Constitutional: She is oriented to person, place, and time. She appears well-developed and well-nourished. No distress.  HENT:  Head: Normocephalic and atraumatic.  Mouth/Throat: Oropharynx is clear and moist.  Eyes: EOM are normal. Pupils are equal, round, and reactive to light.  Neck: Neck  supple. No JVD present.  Cardiovascular: Normal rate, regular rhythm, normal heart sounds and intact distal pulses.  Exam reveals no gallop.   No murmur heard. Pulmonary/Chest: Effort normal and breath sounds normal. She has no wheezes. She has no rales.  Abdominal: Soft. She exhibits no distension. There is no tenderness.  Musculoskeletal: Normal range of motion. She exhibits no tenderness.  Neurological: She is alert and oriented to  person, place, and time. No cranial nerve deficit. She exhibits normal muscle tone.  Skin: Skin is warm and dry. No rash noted.  Psychiatric: Her behavior is normal.    ED Course  Procedures  None Labs Review Labs Reviewed  BASIC METABOLIC PANEL - Abnormal; Notable for the following:    Potassium 3.1 (*)    Glucose, Bld 164 (*)    All other components within normal limits  CBC  I-STAT TROPOININ, ED    Imaging Review Dg Chest 2 View  08/13/2015  CLINICAL DATA:  Nausea.  Shooting pain on the left side. EXAM: CHEST  2 VIEW COMPARISON:  09/05/2014 FINDINGS: Artifact from nipple shadows and EKG leads. There is no edema, consolidation, effusion, or pneumothorax. Normal heart size and mediastinal contours. No osseous findings to explain left-sided symptoms. 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 IMPRESSION: No active cardiopulmonary disease. Electronically Signed   By: Marnee SpringJonathon  Watts M.D.   On: 08/13/2015 14:26   I have personally reviewed and evaluated these images and lab results as part of my medical decision-making.   EKG Interpretation   Date/Time:  Wednesday August 13 2015 13:26:29 EST Ventricular Rate:  81 PR Interval:  136 QRS Duration: 92 QT Interval:  390 QTC Calculation: 453 R Axis:   73 Text Interpretation:  Normal sinus rhythm Right atrial enlargement RSR' or  QR pattern in V1 suggests right ventricular conduction delay Nonspecific  ST abnormality Abnormal ECG Confirmed by BEATON  MD, ROBERT ([id number]) on  08/13/2015 7:09:51 PM      MDM   Final diagnoses:  Chest pain, unspecified chest pain type    Patient is a 64 year old female with a history of hypertension and strong family history of coronary artery disease who presents with left-sided chest pain radiating to her left neck. It was yesterday which resolved and then came back again this morning. Denies shortness of breath, cough, fever, abdominal pain. She is pain-free at this time. EKG  shows nonspecific findings but no acute ST elevation or depression. Chest x-ray is unremarkable. First troponin is undetectable. She didn't HEART score is 4. We discussed admission for observation and ACS workup. 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 Hospitalist consult for admission to discuss risk and benefits with the patient. She again refuses stating that she understands risk of major cardiac event including sudden cardiac death. He also understands that she will have a delay in diagnosis of her symptoms. Patient signed out AGAINST MEDICAL ADVICE. Only encouraged to follow up with her PCP and seek cardiology referral.  Discussed with Dr. Radford PaxBeaton.  Maris BergerJonah Negar Sieler, MD 08/14/15 0120  Nelva Nayobert Beaton, MD 08/14/15 [id number]

## 2015-08-13 NOTE — ED Notes (Signed)
Pt reassessed and denies chest pain at this time, but reports nausea.  Pt updated on status and VSS

## 2015-08-13 NOTE — ED Notes (Signed)
Pt adamant that she does not want to stay in the hospital overnight. Will notify MD

## 2015-08-13 NOTE — ED Notes (Signed)
Pt continuing to endorse plans of going home this evening, continuing to state that she wants to go home. MD and hospitalist aware, AMA form has been signed by patient.

## 2015-08-13 NOTE — H&P (Signed)
[facility] History and Physical  Jane BlaseJudy A Ayad ZOX:[id number] DOB: August 04, 1951 DOA: 08/13/2015  Referring physician: ED physician PCP: Mickie HillierLITTLE,KEVIN LORNE, MD  Specialists:   Chief Complaint:   HPI: Jane Moody is a 64 y.o. female with PMH of hypertension, mitral valve prolapse, and anxiety disorder who presents to the ED after experiencing left-sided chest pain with radiation to the left neck shortly after arriving at work this morning. Patient states that she was in her usual state of health until approximately 2 days ago when she developed nausea without vomiting. She describes herself as an avid walker but noted unusual dyspnea while walking this morning prior to work. There was no accompanying diaphoresis or palpitations. Pain resolved spontaneously prior to arrival and there is been no reoccurrence in the ED. She takes a daily aspirin 81 and has a family history of premature coronary artery disease with fatal MI in her father, as well as CAD in her mother. She takes a daily aspirin 81 and reports undergoing a stress test approximately 10 years ago which she reports as negative.  In ED, patient was found to be afebrile, saturating well on room air, with a mild elevation in blood pressure but no tachycardia. She describes a vague discomfort, and anxiety while in the ED but denies chest pain, nausea, or vomiting. Jane Moody denies calf tenderness or swelling, prolonged immobilization, cancer, prior DVT or PE, and does not take estrogens or smoke. The initial troponin was undetectable and EKG reveals a normal sinus rhythm with nonspecific ST abnormality. She remained hemodynamically stable the emergency department with no further chest pain and will be admitted under observation status for ongoing evaluation.  Where does patient live?   At home    Can patient participate in ADLs?  Yes      Review of Systems:   General: no fevers, chills, sweats, weight change, poor appetite, or  fatigue HEENT: no blurry vision, hearing changes or sore throat Pulm:  Dyspnea on exertion, no cough, or wheeze CV: chest pain per HPI, no palpitations Abd:  nausea, no vomiting, abdominal pain, diarrhea, or constipation GU: no dysuria, hematuria, increased urinary frequency, or urgency  Ext: no leg edema Neuro: no focal weakness, numbness, or tingling, no vision change or hearing loss Skin: no rash, no wounds MSK: No muscle spasm, no deformity, no red, hot, or swollen joint Heme: No easy bruising or bleeding Travel history: No recent long distant travel    Allergy:  Allergies  Allergen Reactions  . Erythromycin Other (See Comments)    Sharp pain in stomach    Past Medical History  Diagnosis Date  . Hypertension   . Panic attacks   . Arthritis   . Osteopenia     Past Surgical History  Procedure Laterality Date  . Cholecystectomy  2008  . Total abdominal hysterectomy w/ bilateral salpingoophorectomy  2008    Social History:  reports that she has never smoked. She does not have any smokeless tobacco history on file. She reports that she does not drink alcohol or use illicit drugs.  Family History: No family history on file.   Prior to Admission medications   Medication Sig Start Date End Date Taking? Authorizing Provider  aspirin 81 MG chewable tablet Chew 81 mg by mouth daily.     Yes Historical Provider, MD  benazepril-hydrochlorthiazide (LOTENSIN HCT) 20-12.5 MG tablet Take 1 tablet by mouth daily.   Yes Historical Provider, MD  Calcium Citrate (CALCITRATE PO) Take 1 tablet by mouth  daily.   Yes Historical Provider, MD  clonazePAM (KLONOPIN) 0.5 MG tablet Take 0.5 mg by mouth at bedtime as needed.     Yes Historical Provider, MD  nortriptyline (PAMELOR) 50 MG capsule Take 50 mg by mouth at bedtime.     Yes Historical Provider, MD  calcium-vitamin D (OSCAL WITH D) 500-200 MG-UNIT per tablet Take 1 tablet by mouth 3 (three) times daily.      Historical Provider, MD   olmesartan-hydrochlorothiazide (BENICAR HCT) 20-12.5 MG per tablet Take 1 tablet by mouth daily.      Historical Provider, MD    Physical Exam: Filed Vitals:   08/13/15 1741 08/13/15 1845 08/13/15 1900 08/13/15 1915  BP: 135/81 142/79 142/84 139/84  Pulse: 71 64 63 67  Temp:      TempSrc:      Resp: Height:      Weight:      SpO2: 98% 98% 97% 99%   General: Not in acute distress HEENT:       Eyes: PERRL, EOMI, no scleral icterus or conjunctival pallor.       ENT: No discharge from the ears or nose, no pharyngeal ulcers, petechiae or exudate, no tonsillar enlargement.        Neck: No JVD, no bruit, no appreciable mass Heme: No cervical adenopathy, no pallor Cardiac: S1/S2, RRR, No murmurs, No gallops or rubs. Pulm: Good air movement bilaterally. No rales, wheezing, rhonchi or rubs. Abd: Soft, nondistended, nontender, no rebound pain or gaurding, no mass or organomegaly, BS present. Ext: No LE edema bilaterally. 2+DP/PT pulse bilaterally. Musculoskeletal: No gross deformity, no red, hot, swollen joints, no limitation in ROM  Skin: No rashes or wounds on exposed surfaces  Neuro: Alert, oriented X3, cranial nerves II-XII grossly intact no focal findings Psych: Patient is not overtly psychotic.  Labs on Admission:  Basic Metabolic Panel:  Recent Labs Lab 08/13/15 1343  NA 138  K 3.1*  CL 103  CO2 26  GLUCOSE 164*  BUN 11  CREATININE 0.87  CALCIUM 9.7   Liver Function Tests: No results for input(s): AST, ALT, ALKPHOS, BILITOT, PROT, ALBUMIN in the last 168 hours. No results for input(s): LIPASE, AMYLASE in the last 168 hours. No results for input(s): AMMONIA in the last 168 hours. CBC:  Recent Labs Lab 08/13/15 1343  WBC 6.7  HGB 13.9  HCT 41.2  MCV 88.0  PLT 274   Cardiac Enzymes: No results for input(s): CKTOTAL, CKMB, CKMBINDEX, TROPONINI in the last 168 hours.  BNP (last 3 results) No results for input(s): BNP in the last 8760  hours.  ProBNP (last 3 results) No results for input(s): PROBNP in the last 8760 hours.  CBG: No results for input(s): GLUCAP in the last 168 hours.  Radiological Exams on Admission: Dg Chest 2 View  08/13/2015  CLINICAL DATA:  Nausea.  Shooting pain on the left side. EXAM: CHEST  2 VIEW COMPARISON:  09/05/2014 FINDINGS: Artifact from nipple shadows and EKG leads. There is no edema, consolidation, effusion, or pneumothorax. Normal heart size and mediastinal contours. No osseous findings to explain left-sided symptoms. 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 IMPRESSION: No active cardiopulmonary disease. Electronically Signed   By: Marnee Spring M.D.   On: 08/13/2015 14:26    EKG: Independently reviewed.  Abnormal findings:       NSR, non-specific Tw abnormality, no prior for comparison   Assessment/Plan 1. Chest pain - Patient left from  the ED against my medical advice, after expressing understanding that she could very well be having a myocardial infarction or other potentially fatal cardiovascular process. Her husband, Jane Moody was at the bedside during this conversation.      DVT ppx: SQ Heparin  SQ Lovenox   SCDs  Code Status: Full code Family Communication: None at bed side.              Yes, patient's       at bed side Disposition Plan: Admit to inpatient   Date of Service 08/13/2015    Briscoe Deutscher [facility] Pager [phone]  If 7PM-7AM, please contact night-coverage [email or web address] Password [email or web address] 08/13/2015, 8:16 PM

## 2015-08-13 NOTE — ED Notes (Addendum)
Pt states last night she was feeling very nauseous into the morning and around 1100 she had a sharp shooting pain in the left side of her chest that radiating to left side of neck.  Pt states she just feels very weak now. Pt reports no pain at this time.

## 2015-08-25 ENCOUNTER — Telehealth: Payer: Self-pay

## 2015-08-25 ENCOUNTER — Telehealth: Payer: Self-pay | Admitting: Cardiology

## 2015-08-25 NOTE — Telephone Encounter (Signed)
Received records from [facility] for appointment on 09/02/15 with Dr Jens Somrenshaw.  [facility] for Dr Ludwig Clarksrenshaw's schedule on 09/02/15. lp

## 2015-08-25 NOTE — Telephone Encounter (Signed)
FAXED NOTES TO [facility] 

## 2015-08-27 NOTE — Progress Notes (Signed)
     HPI: 64 yo female for evaluation of chest pain. Seen in ER 12/16 with chest pain; troponin and chest xray negative; Hgb 13.9. Patient states that on the day of her symptoms she was at work and developed pain in the left breast area. It was described as a sharp pain with some pain in her neck. No associated symptoms. Not pleuritic or positional. Lasted 1 hour and resolve spontaneously. She does not have exertional chest pain. She notes some dyspnea on exertion but no orthopnea or PND. No pedal edema or syncope.  Current Outpatient Prescriptions  Medication Sig Dispense Refill  . aspirin 81 MG chewable tablet Chew 81 mg by mouth daily.      . Benazepril-Hydrochlorothiazide (LOTENSIN HCT PO) Take 1 tablet by mouth daily.    . calcium-vitamin D (OSCAL WITH D) 500-200 MG-UNIT per tablet Take 1 tablet by mouth daily. Reported on 09/02/2015    . clonazePAM (KLONOPIN) 0.5 MG tablet Take 0.5 mg by mouth at bedtime as needed for anxiety.     . nortriptyline (PAMELOR) 50 MG capsule Take 50 mg by mouth at bedtime.       No current facility-administered medications for this visit.    Allergies  Allergen Reactions  . Erythromycin Other (See Comments)    Sharp pain in stomach     Past Medical History  Diagnosis Date  . Hypertension   . Panic attacks   . Arthritis   . Osteopenia     Past Surgical History  Procedure Laterality Date  . Cholecystectomy  2008  . Total abdominal hysterectomy w/ bilateral salpingoophorectomy  2008    Social History   Social History  . Marital Status: Married    Spouse Name: N/A  . Number of Children: 2  . Years of Education: N/A   Occupational History  . Not on file.   Social History Main Topics  . Smoking status: Never Smoker   . Smokeless tobacco: Never Used  . Alcohol Use: No  . Drug Use: No  . Sexual Activity: Not on file   Other Topics Concern  . Not on file   Social History Narrative    Family History  Problem Relation Age of Onset    . Diabetes Mother   . Hypertension Mother   . Hyperlipidemia Mother   . Hypertension Father   . Hyperlipidemia Father   . Hyperlipidemia Sister   . Hypertension Sister   . Hyperlipidemia Sister   . Hypertension Sister   . CAD Father     MI at age 66    ROS: no fevers or chills, productive cough, hemoptysis, dysphasia, odynophagia, melena, hematochezia, dysuria, hematuria, rash, seizure activity, orthopnea, PND, pedal edema, claudication. Remaining systems are negative.  Physical Exam:   Blood pressure 94/60, pulse 64, height 5\' 1"  (1.549 m), weight 114 lb 14.4 oz (52.118 kg).  General:  Well developed/well nourished in NAD Skin warm/dry Patient not depressed No peripheral clubbing Back-normal HEENT-normal/normal eyelids Neck supple/normal carotid upstroke bilaterally; no bruits; no JVD; no thyromegaly chest - CTA/ normal expansion CV - RRR/normal S1 and S2; no murmurs, rubs or gallops;  PMI nondisplaced Abdomen -NT/ND, no HSM, no mass, + bowel sounds, no bruit 2+ femoral pulses, no bruits Ext-no edema, chords, 2+ DP Neuro-grossly nonfocal  ECG 08/13/15-NSR, incomplete RBBB, RAE, nonspecific ST changes

## 2015-09-02 ENCOUNTER — Ambulatory Visit (INDEPENDENT_AMBULATORY_CARE_PROVIDER_SITE_OTHER): Payer: 59 | Admitting: Cardiology

## 2015-09-02 ENCOUNTER — Encounter: Payer: Self-pay | Admitting: Cardiology

## 2015-09-02 VITALS — BP 94/60 | HR 64 | Ht 61.0 in | Wt 114.9 lb

## 2015-09-02 DIAGNOSIS — I10 Essential (primary) hypertension: Secondary | ICD-10-CM | POA: Diagnosis not present

## 2015-09-02 DIAGNOSIS — R079 Chest pain, unspecified: Secondary | ICD-10-CM | POA: Diagnosis not present

## 2015-09-02 DIAGNOSIS — F419 Anxiety disorder, unspecified: Secondary | ICD-10-CM | POA: Diagnosis not present

## 2015-09-02 NOTE — Patient Instructions (Signed)
Your physician recommends that you schedule a follow-up appointment in: As Needed  Your physician has requested that you have a stress echocardiogram [facility]. For further information please visit [email or web address]. Please follow instruction sheet as given.           Happy New Year!!

## 2015-09-02 NOTE — Assessment & Plan Note (Addendum)
Symptoms somewhat atypical but risk factors including family history and hypertension. Plan stress echocardiogram for risk stratification. She will need imaging because of baseline electrocardiographic changes.

## 2015-09-02 NOTE — Assessment & Plan Note (Signed)
Continue present medications. Management per primary care. 

## 2015-09-02 NOTE — Assessment & Plan Note (Signed)
Blood pressure controlled. Continue present medications. 

## 2015-10-06 ENCOUNTER — Other Ambulatory Visit (HOSPITAL_COMMUNITY): Payer: 59

## 2015-10-06 ENCOUNTER — Other Ambulatory Visit: Payer: Self-pay | Admitting: Cardiovascular Disease

## 2015-10-07 ENCOUNTER — Other Ambulatory Visit (HOSPITAL_COMMUNITY): Payer: 59

## 2015-10-16 ENCOUNTER — Ambulatory Visit (HOSPITAL_BASED_OUTPATIENT_CLINIC_OR_DEPARTMENT_OTHER): Payer: 59

## 2015-10-16 ENCOUNTER — Ambulatory Visit (HOSPITAL_COMMUNITY): Payer: 59 | Attending: Cardiology

## 2015-10-16 DIAGNOSIS — R079 Chest pain, unspecified: Secondary | ICD-10-CM | POA: Insufficient documentation

## 2015-10-16 DIAGNOSIS — R0989 Other specified symptoms and signs involving the circulatory and respiratory systems: Secondary | ICD-10-CM

## 2015-10-17 LAB — ECHOCARDIOGRAM STRESS TEST
CHL CUP STRESS STAGE 1 HR: 85 {beats}/min
CHL CUP STRESS STAGE 2 GRADE: 0 %
CHL CUP STRESS STAGE 2 HR: 98 {beats}/min
CHL CUP STRESS STAGE 3 GRADE: 0 %
CHL CUP STRESS STAGE 3 HR: 98 {beats}/min
CHL CUP STRESS STAGE 3 SPEED: 0 mph
CHL CUP STRESS STAGE 4 DBP: 66 mmHg
CHL CUP STRESS STAGE 4 SBP: 123 mmHg
CHL CUP STRESS STAGE 5 SBP: 141 mmHg
CHL CUP STRESS STAGE 6 GRADE: 14 %
CHL CUP STRESS STAGE 6 SPEED: 3.4 mph
CHL CUP STRESS STAGE 7 HR: 155 {beats}/min
CHL CUP STRESS STAGE 8 HR: 136 {beats}/min
CHL CUP STRESS STAGE 8 SBP: 111 mmHg
CHL CUP STRESS STAGE 8 SPEED: 0 mph
CHL CUP STRESS STAGE 9 DBP: 74 mmHg
CHL CUP STRESS STAGE 9 SBP: 127 mmHg
CSEPPHR: 155 {beats}/min
Estimated workload: 10.1 METS
Percent of predicted max HR: 100 %
Stage 1 DBP: 51 mmHg
Stage 1 Grade: 0 %
Stage 1 SBP: 103 mmHg
Stage 1 Speed: 0 mph
Stage 2 DBP: 55 mmHg
Stage 2 SBP: 79 mmHg
Stage 2 Speed: 0 mph
Stage 4 Grade: 10 %
Stage 4 HR: 116 {beats}/min
Stage 4 Speed: 1.7 mph
Stage 5 DBP: 59 mmHg
Stage 5 Grade: 12 %
Stage 5 HR: 134 {beats}/min
Stage 5 Speed: 2.5 mph
Stage 6 DBP: 60 mmHg
Stage 6 HR: 155 {beats}/min
Stage 6 SBP: 96 mmHg
Stage 7 Grade: 14 %
Stage 7 Speed: 3.4 mph
Stage 8 DBP: 56 mmHg
Stage 8 Grade: 0 %
Stage 9 Grade: 0 %
Stage 9 HR: 100 {beats}/min
Stage 9 Speed: 0 mph

## 2015-10-21 ENCOUNTER — Encounter: Payer: Self-pay | Admitting: Cardiology

## 2015-10-21 NOTE — Telephone Encounter (Signed)
This encounter was created in error - please disregard.

## 2015-10-21 NOTE — Telephone Encounter (Signed)
New message   Pt calling for RN she is returning call for her results of her stress test

## 2016-04-09 ENCOUNTER — Other Ambulatory Visit: Payer: Self-pay | Admitting: Otolaryngology

## 2016-10-01 ENCOUNTER — Other Ambulatory Visit: Payer: Self-pay

## 2016-12-02 IMAGING — US US EXTREM LOW VENOUS*R*
1 series · 13 of 24 positions shown · non-contrast
Comparison: None.

CLINICAL DATA: Erythema and tenderness in the upper right calf, 4
days duration



[Series 1: us extrem low venous*right* · 0.08mm/px · 32 acquisitions, 13 frames shown]
[im 1/32]
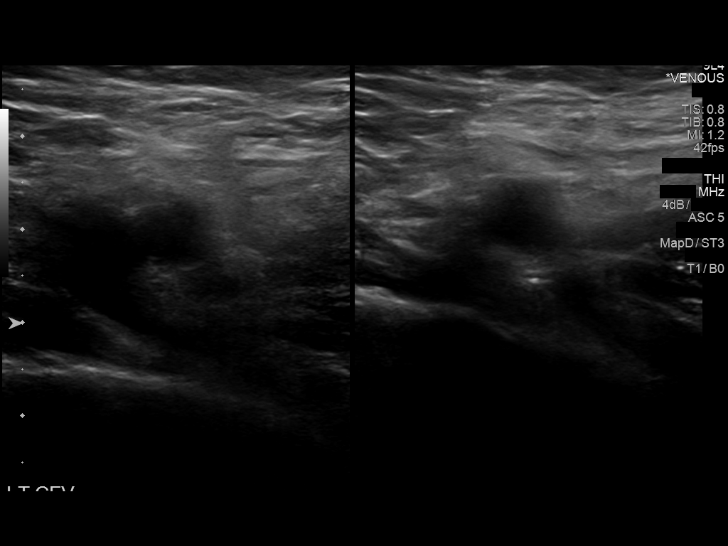
[im 3/32]
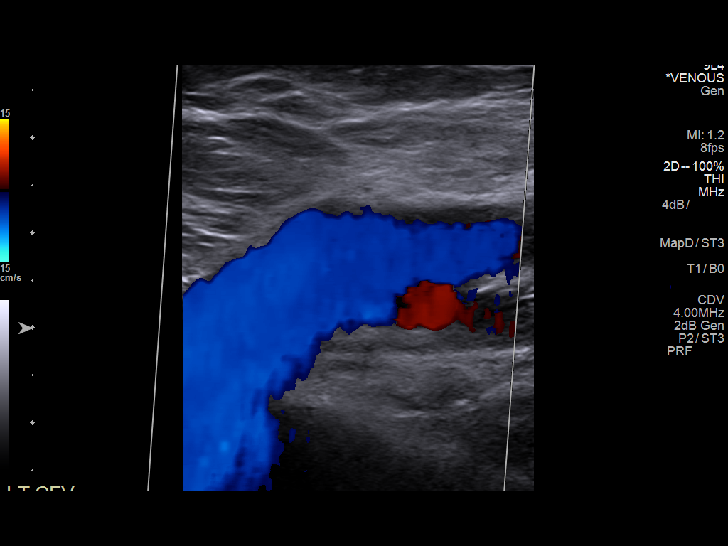
[im 6/32]
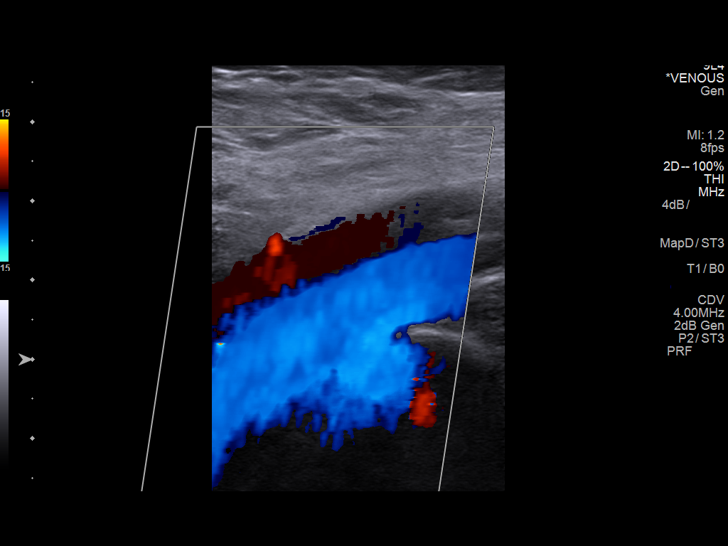
[im 9/32]
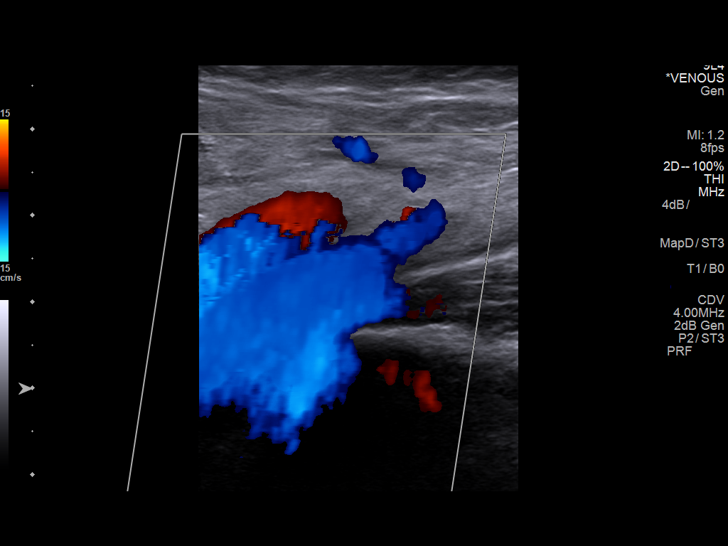
[im 11/32]
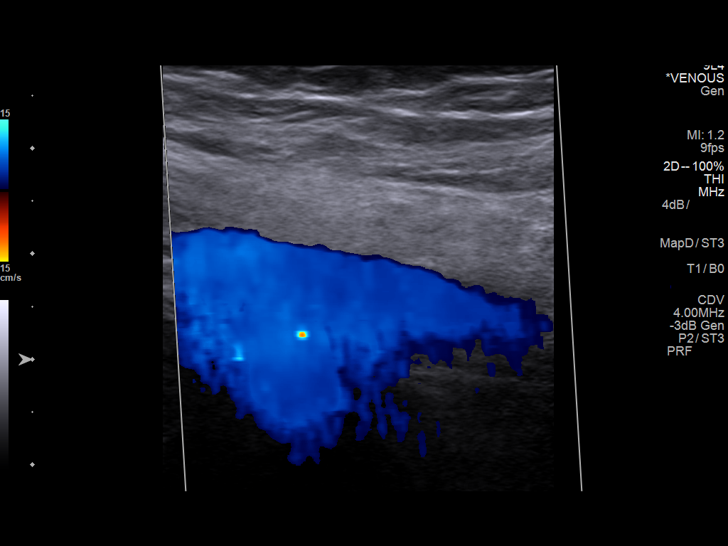
[im 14/32]
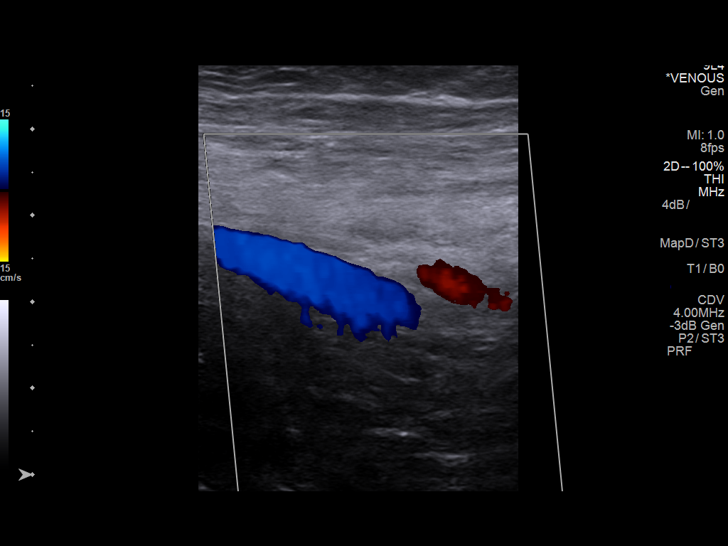
[im 18/32]
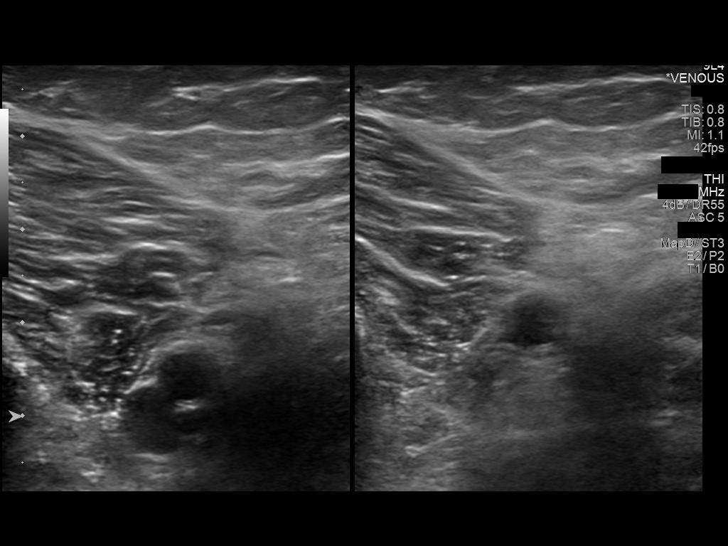
[im 19/32]
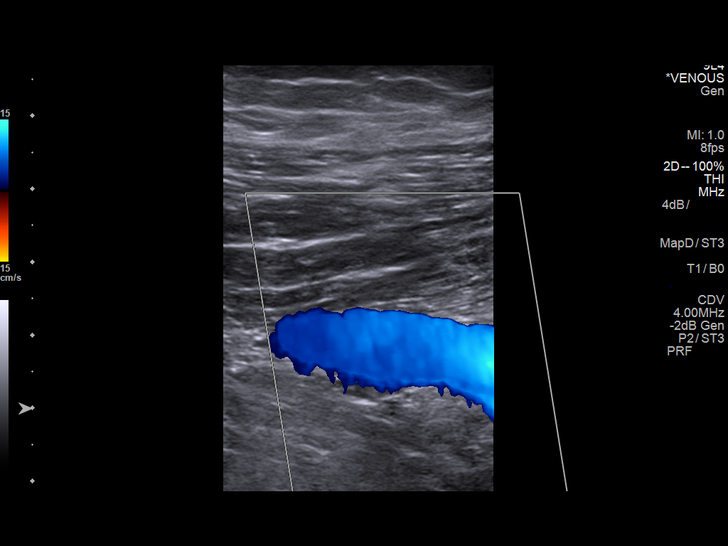
[im 22/32]
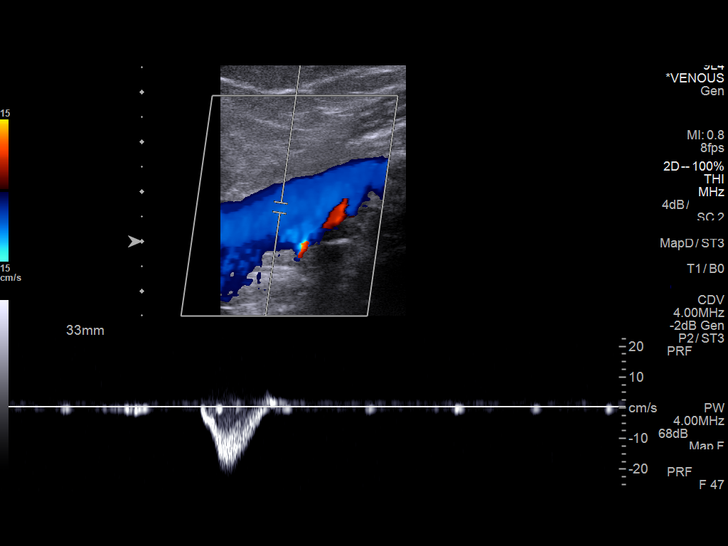
[im 25/32]
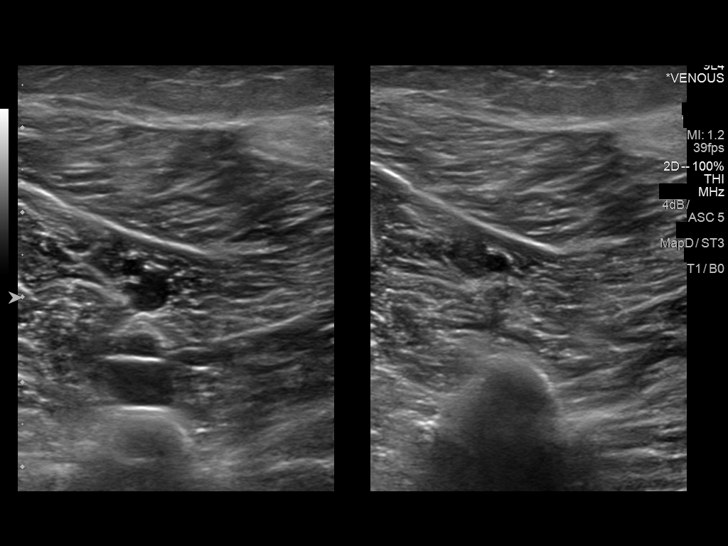
[im 27/32]
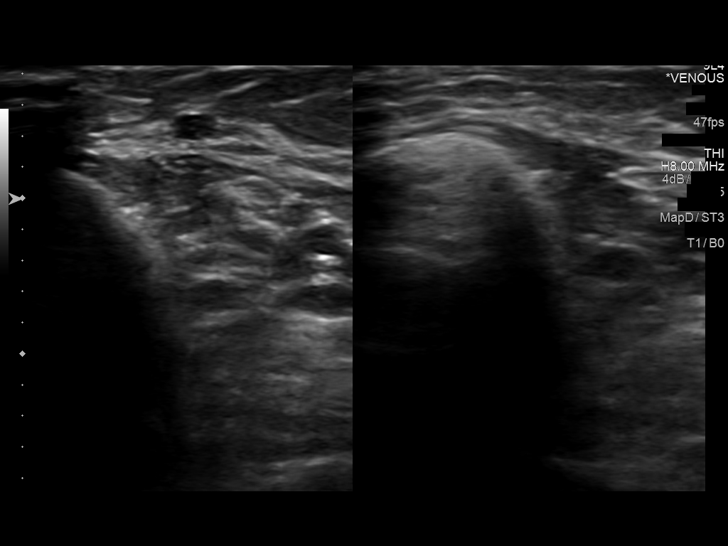
[im 30/32]
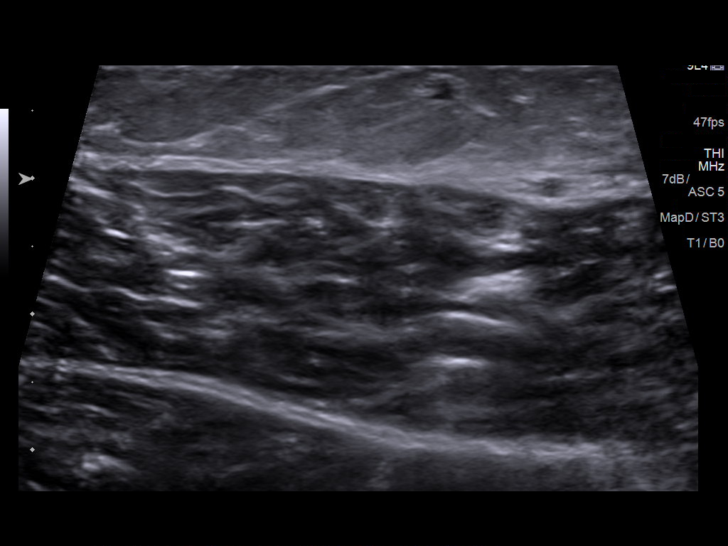
[im 32/32]
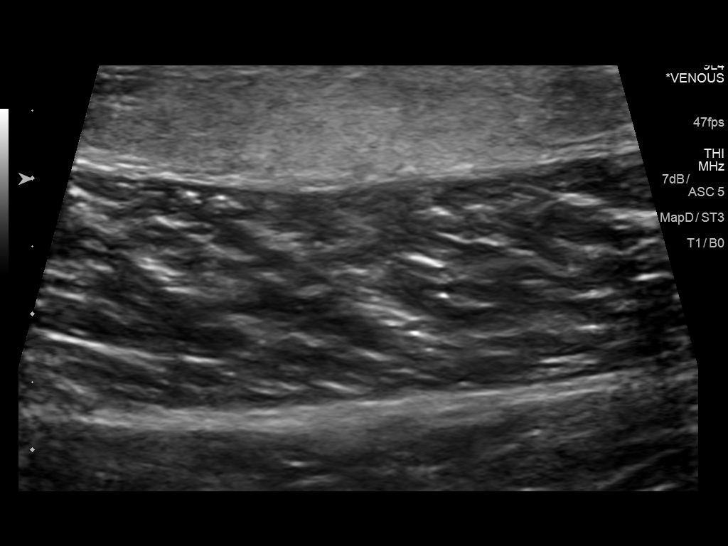

[13 of 24 positions shown; findings below may reference images not displayed]

FINDINGS: Contralateral Common Femoral Vein: Respiratory phasicity is normal
and symmetric with the symptomatic side. No evidence of thrombus.
Normal compressibility.

Common Femoral Vein: No evidence of thrombus. Normal
compressibility, respiratory phasicity and response to augmentation.

Saphenofemoral Junction: No evidence of thrombus. Normal
compressibility and flow on color Doppler imaging.

Profunda Femoral Vein: No evidence of thrombus. Normal
compressibility and flow on color Doppler imaging.

Femoral Vein: No evidence of thrombus. Normal compressibility,
respiratory phasicity and response to augmentation.

Popliteal Vein: No evidence of thrombus. Normal compressibility,
respiratory phasicity and response to augmentation.

Calf Veins: No evidence of thrombus. Normal compressibility and flow
on color Doppler imaging.

Superficial Great Saphenous Vein: No evidence of thrombus. Normal
compressibility and flow on color Doppler imaging.

Venous Reflux:  None.

Other Findings:  None.
IMPRESSION: No evidence of deep venous thrombosis.

## 2021-07-31 ENCOUNTER — Emergency Department (HOSPITAL_COMMUNITY): Payer: No Typology Code available for payment source

## 2021-07-31 ENCOUNTER — Emergency Department (HOSPITAL_COMMUNITY)
Admission: EM | Admit: 2021-07-31 | Discharge: 2021-07-31 | Disposition: A | Discharge status: Home / Self Care | Payer: No Typology Code available for payment source | Attending: Emergency Medicine | Admitting: Emergency Medicine

## 2021-07-31 ENCOUNTER — Other Ambulatory Visit: Payer: Self-pay

## 2021-07-31 ENCOUNTER — Encounter (HOSPITAL_COMMUNITY): Payer: Self-pay

## 2021-07-31 DIAGNOSIS — Y92410 Unspecified street and highway as the place of occurrence of the external cause: Secondary | ICD-10-CM | POA: Insufficient documentation

## 2021-07-31 DIAGNOSIS — M25511 Pain in right shoulder: Secondary | ICD-10-CM | POA: Diagnosis not present

## 2021-07-31 DIAGNOSIS — Z79899 Other long term (current) drug therapy: Secondary | ICD-10-CM | POA: Diagnosis not present

## 2021-07-31 DIAGNOSIS — M542 Cervicalgia: Secondary | ICD-10-CM | POA: Insufficient documentation

## 2021-07-31 DIAGNOSIS — I10 Essential (primary) hypertension: Secondary | ICD-10-CM | POA: Diagnosis not present

## 2021-07-31 DIAGNOSIS — Z7982 Long term (current) use of aspirin: Secondary | ICD-10-CM | POA: Diagnosis not present

## 2021-07-31 DIAGNOSIS — R519 Headache, unspecified: Secondary | ICD-10-CM | POA: Diagnosis not present

## 2021-07-31 LAB — COMPREHENSIVE METABOLIC PANEL
ALT: 40 U/L (ref 0–44)
AST: 36 U/L (ref 15–41)
Albumin: 4 g/dL (ref 3.5–5.0)
Alkaline Phosphatase: 72 U/L (ref 38–126)
Anion gap: 9 (ref 5–15)
BUN: 15 mg/dL (ref 8–23)
CO2: 26 mmol/L (ref 22–32)
Calcium: 9.5 mg/dL (ref 8.9–10.3)
Chloride: 101 mmol/L (ref 98–111)
Creatinine, Ser: 0.87 mg/dL (ref 0.44–1.00)
GFR, Estimated: >60 mL/min (ref >60)
Glucose, Bld: 108 mg/dL — ABNORMAL HIGH (ref 70–99)
Potassium: 3.6 mmol/L (ref 3.5–5.1)
Sodium: 136 mmol/L (ref 135–145)
Total Bilirubin: 1.1 mg/dL (ref 0.3–1.2)
Total Protein: 6.9 g/dL (ref 6.5–8.1)

## 2021-07-31 LAB — CBC WITH DIFFERENTIAL/PLATELET
Abs Immature Granulocytes: 0.02 10*3/uL (ref 0.00–0.07)
Basophils Absolute: 0.1 10*3/uL (ref 0.0–0.1)
Basophils Relative: 1 %
Eosinophils Absolute: 0.2 10*3/uL (ref 0.0–0.5)
Eosinophils Relative: 2 %
HCT: 40.6 % (ref 36.0–46.0)
Hemoglobin: 13.8 g/dL (ref 12.0–15.0)
Immature Granulocytes: 0 %
Lymphocytes Relative: 21 %
Lymphs Abs: 1.6 10*3/uL (ref 0.7–4.0)
MCH: 30.7 pg (ref 26.0–34.0)
MCHC: 34 g/dL (ref 30.0–36.0)
MCV: 90.4 fL (ref 80.0–100.0)
Monocytes Absolute: 0.5 10*3/uL (ref 0.1–1.0)
Monocytes Relative: 6 %
Neutro Abs: 5.5 10*3/uL (ref 1.7–7.7)
Neutrophils Relative %: 70 %
Platelets: 274 10*3/uL (ref 150–400)
RBC: 4.49 MIL/uL (ref 3.87–5.11)
RDW: 12.6 % (ref 11.5–15.5)
WBC: 7.9 10*3/uL (ref 4.0–10.5)
nRBC: 0 % (ref 0.0–0.2)

## 2021-07-31 MED ORDER — FLUORESCEIN SODIUM 1 MG OP STRP
1.0000 | ORAL_STRIP | Freq: Once | OPHTHALMIC | Status: AC
  Administered 2021-07-31: 1 via OPHTHALMIC
  Filled 2021-07-31: qty 1

## 2021-07-31 MED ORDER — TETRACAINE HCL 0.5 % OP SOLN
2.0000 [drp] | Freq: Once | OPHTHALMIC | Status: AC
  Administered 2021-07-31: 2 [drp] via OPHTHALMIC
  Filled 2021-07-31: qty 4

## 2021-07-31 MED ORDER — METHOCARBAMOL 500 MG PO TABS: 500.0000 mg | ORAL_TABLET | Freq: Two times a day (BID) | ORAL | 0 refills | Status: AC

## 2021-07-31 NOTE — Discharge Instructions (Addendum)
Your imaging in the ED was negative for fracture or dislocations. You will feel more sore in the morning. You are prescribed a muscle relaxer. Do not drive or operate heavy machinery while taking the muscle relaxer.  You may take over-the-counter 500 mg Tylenol every 6 hours as needed for pain.  You may apply ice or heat to affected area for up to 15 minutes at a time.  You may follow-up with your primary care provider as needed.  Return to the Emergency Department if you are experiencing increasing/worsening chest pain, abdominal pain, trouble breathing.
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
## 2021-07-31 NOTE — ED Notes (Signed)
During triage pt c.o neck pain- c collar applied.
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
## 2021-07-31 NOTE — ED Notes (Signed)
Vision acuity test, 25ft equivalent complete:  Right eye covered: 20/40 Left eye covered: 20/40 No eye covered: 20/35

## 2021-08-01 ENCOUNTER — Telehealth (HOSPITAL_COMMUNITY): Payer: Self-pay | Admitting: Emergency Medicine

## 2021-10-15 NOTE — Telephone Encounter (Signed)
Opened in error, please disregard.

## 2022-04-07 DIAGNOSIS — J3089 Other allergic rhinitis: Secondary | ICD-10-CM | POA: Diagnosis not present

## 2022-04-07 DIAGNOSIS — J301 Allergic rhinitis due to pollen: Secondary | ICD-10-CM | POA: Diagnosis not present

## 2022-04-07 DIAGNOSIS — J3081 Allergic rhinitis due to animal (cat) (dog) hair and dander: Secondary | ICD-10-CM | POA: Diagnosis not present

## 2022-04-14 DIAGNOSIS — J301 Allergic rhinitis due to pollen: Secondary | ICD-10-CM | POA: Diagnosis not present

## 2022-04-14 DIAGNOSIS — J3081 Allergic rhinitis due to animal (cat) (dog) hair and dander: Secondary | ICD-10-CM | POA: Diagnosis not present

## 2022-04-14 DIAGNOSIS — J3089 Other allergic rhinitis: Secondary | ICD-10-CM | POA: Diagnosis not present

## 2022-04-21 DIAGNOSIS — J3081 Allergic rhinitis due to animal (cat) (dog) hair and dander: Secondary | ICD-10-CM | POA: Diagnosis not present

## 2022-04-21 DIAGNOSIS — J3089 Other allergic rhinitis: Secondary | ICD-10-CM | POA: Diagnosis not present

## 2022-04-21 DIAGNOSIS — J301 Allergic rhinitis due to pollen: Secondary | ICD-10-CM | POA: Diagnosis not present

## 2022-04-29 DIAGNOSIS — J3089 Other allergic rhinitis: Secondary | ICD-10-CM | POA: Diagnosis not present

## 2022-04-29 DIAGNOSIS — J3081 Allergic rhinitis due to animal (cat) (dog) hair and dander: Secondary | ICD-10-CM | POA: Diagnosis not present

## 2022-04-29 DIAGNOSIS — J301 Allergic rhinitis due to pollen: Secondary | ICD-10-CM | POA: Diagnosis not present

## 2022-05-05 DIAGNOSIS — J301 Allergic rhinitis due to pollen: Secondary | ICD-10-CM | POA: Diagnosis not present

## 2022-05-05 DIAGNOSIS — J3081 Allergic rhinitis due to animal (cat) (dog) hair and dander: Secondary | ICD-10-CM | POA: Diagnosis not present

## 2022-05-05 DIAGNOSIS — J3089 Other allergic rhinitis: Secondary | ICD-10-CM | POA: Diagnosis not present

## 2022-05-06 DIAGNOSIS — Z136 Encounter for screening for cardiovascular disorders: Secondary | ICD-10-CM | POA: Diagnosis not present

## 2022-05-06 DIAGNOSIS — I10 Essential (primary) hypertension: Secondary | ICD-10-CM | POA: Diagnosis not present

## 2022-05-06 DIAGNOSIS — E559 Vitamin D deficiency, unspecified: Secondary | ICD-10-CM | POA: Diagnosis not present

## 2022-05-19 DIAGNOSIS — J301 Allergic rhinitis due to pollen: Secondary | ICD-10-CM | POA: Diagnosis not present

## 2022-05-19 DIAGNOSIS — J3089 Other allergic rhinitis: Secondary | ICD-10-CM | POA: Diagnosis not present

## 2022-05-19 DIAGNOSIS — J3081 Allergic rhinitis due to animal (cat) (dog) hair and dander: Secondary | ICD-10-CM | POA: Diagnosis not present

## 2022-05-20 DIAGNOSIS — J3089 Other allergic rhinitis: Secondary | ICD-10-CM | POA: Diagnosis not present

## 2022-05-20 DIAGNOSIS — J301 Allergic rhinitis due to pollen: Secondary | ICD-10-CM | POA: Diagnosis not present

## 2022-06-02 DIAGNOSIS — J301 Allergic rhinitis due to pollen: Secondary | ICD-10-CM | POA: Diagnosis not present

## 2022-06-02 DIAGNOSIS — J3081 Allergic rhinitis due to animal (cat) (dog) hair and dander: Secondary | ICD-10-CM | POA: Diagnosis not present

## 2022-06-02 DIAGNOSIS — J3089 Other allergic rhinitis: Secondary | ICD-10-CM | POA: Diagnosis not present

## 2022-06-10 DIAGNOSIS — J029 Acute pharyngitis, unspecified: Secondary | ICD-10-CM | POA: Diagnosis not present

## 2022-06-10 DIAGNOSIS — J40 Bronchitis, not specified as acute or chronic: Secondary | ICD-10-CM | POA: Diagnosis not present

## 2022-06-11 DIAGNOSIS — J3089 Other allergic rhinitis: Secondary | ICD-10-CM | POA: Diagnosis not present

## 2022-06-11 DIAGNOSIS — J301 Allergic rhinitis due to pollen: Secondary | ICD-10-CM | POA: Diagnosis not present

## 2022-06-15 DIAGNOSIS — J3089 Other allergic rhinitis: Secondary | ICD-10-CM | POA: Diagnosis not present

## 2022-06-15 DIAGNOSIS — J301 Allergic rhinitis due to pollen: Secondary | ICD-10-CM | POA: Diagnosis not present

## 2022-06-17 DIAGNOSIS — Z1231 Encounter for screening mammogram for malignant neoplasm of breast: Secondary | ICD-10-CM | POA: Diagnosis not present

## 2022-06-21 DIAGNOSIS — J3089 Other allergic rhinitis: Secondary | ICD-10-CM | POA: Diagnosis not present

## 2022-06-21 DIAGNOSIS — J301 Allergic rhinitis due to pollen: Secondary | ICD-10-CM | POA: Diagnosis not present

## 2022-06-21 DIAGNOSIS — J3081 Allergic rhinitis due to animal (cat) (dog) hair and dander: Secondary | ICD-10-CM | POA: Diagnosis not present

## 2022-07-01 DIAGNOSIS — J45998 Other asthma: Secondary | ICD-10-CM | POA: Diagnosis not present

## 2022-07-01 DIAGNOSIS — R053 Chronic cough: Secondary | ICD-10-CM | POA: Diagnosis not present

## 2022-07-01 DIAGNOSIS — J301 Allergic rhinitis due to pollen: Secondary | ICD-10-CM | POA: Diagnosis not present

## 2022-07-01 DIAGNOSIS — J3089 Other allergic rhinitis: Secondary | ICD-10-CM | POA: Diagnosis not present

## 2022-07-01 DIAGNOSIS — J3081 Allergic rhinitis due to animal (cat) (dog) hair and dander: Secondary | ICD-10-CM | POA: Diagnosis not present

## 2022-07-02 DIAGNOSIS — J029 Acute pharyngitis, unspecified: Secondary | ICD-10-CM | POA: Diagnosis not present

## 2022-07-02 DIAGNOSIS — Z6821 Body mass index (BMI) 21.0-21.9, adult: Secondary | ICD-10-CM | POA: Diagnosis not present

## 2022-07-06 DIAGNOSIS — J3089 Other allergic rhinitis: Secondary | ICD-10-CM | POA: Diagnosis not present

## 2022-07-06 DIAGNOSIS — J3081 Allergic rhinitis due to animal (cat) (dog) hair and dander: Secondary | ICD-10-CM | POA: Diagnosis not present

## 2022-07-06 DIAGNOSIS — J301 Allergic rhinitis due to pollen: Secondary | ICD-10-CM | POA: Diagnosis not present

## 2022-07-13 DIAGNOSIS — J3081 Allergic rhinitis due to animal (cat) (dog) hair and dander: Secondary | ICD-10-CM | POA: Diagnosis not present

## 2022-07-13 DIAGNOSIS — J301 Allergic rhinitis due to pollen: Secondary | ICD-10-CM | POA: Diagnosis not present

## 2022-07-13 DIAGNOSIS — J3089 Other allergic rhinitis: Secondary | ICD-10-CM | POA: Diagnosis not present

## 2022-07-20 DIAGNOSIS — J3089 Other allergic rhinitis: Secondary | ICD-10-CM | POA: Diagnosis not present

## 2022-07-20 DIAGNOSIS — J301 Allergic rhinitis due to pollen: Secondary | ICD-10-CM | POA: Diagnosis not present

## 2022-07-23 DIAGNOSIS — I10 Essential (primary) hypertension: Secondary | ICD-10-CM | POA: Diagnosis not present

## 2022-07-23 DIAGNOSIS — M1990 Unspecified osteoarthritis, unspecified site: Secondary | ICD-10-CM | POA: Diagnosis not present

## 2022-07-23 DIAGNOSIS — M810 Age-related osteoporosis without current pathological fracture: Secondary | ICD-10-CM | POA: Diagnosis not present

## 2022-07-23 DIAGNOSIS — Z6821 Body mass index (BMI) 21.0-21.9, adult: Secondary | ICD-10-CM | POA: Diagnosis not present

## 2022-07-23 DIAGNOSIS — F419 Anxiety disorder, unspecified: Secondary | ICD-10-CM | POA: Diagnosis not present

## 2022-07-23 DIAGNOSIS — E559 Vitamin D deficiency, unspecified: Secondary | ICD-10-CM | POA: Diagnosis not present

## 2022-07-23 DIAGNOSIS — Z0000 Encounter for general adult medical examination without abnormal findings: Secondary | ICD-10-CM | POA: Diagnosis not present

## 2022-07-26 DIAGNOSIS — J301 Allergic rhinitis due to pollen: Secondary | ICD-10-CM | POA: Diagnosis not present

## 2022-07-26 DIAGNOSIS — J3089 Other allergic rhinitis: Secondary | ICD-10-CM | POA: Diagnosis not present

## 2022-08-04 DIAGNOSIS — J3081 Allergic rhinitis due to animal (cat) (dog) hair and dander: Secondary | ICD-10-CM | POA: Diagnosis not present

## 2022-08-04 DIAGNOSIS — J3089 Other allergic rhinitis: Secondary | ICD-10-CM | POA: Diagnosis not present

## 2022-08-04 DIAGNOSIS — J301 Allergic rhinitis due to pollen: Secondary | ICD-10-CM | POA: Diagnosis not present

## 2022-08-09 DIAGNOSIS — J3089 Other allergic rhinitis: Secondary | ICD-10-CM | POA: Diagnosis not present

## 2022-08-09 DIAGNOSIS — J301 Allergic rhinitis due to pollen: Secondary | ICD-10-CM | POA: Diagnosis not present

## 2022-08-09 DIAGNOSIS — J3081 Allergic rhinitis due to animal (cat) (dog) hair and dander: Secondary | ICD-10-CM | POA: Diagnosis not present

## 2022-08-16 DIAGNOSIS — J301 Allergic rhinitis due to pollen: Secondary | ICD-10-CM | POA: Diagnosis not present

## 2022-08-16 DIAGNOSIS — J3089 Other allergic rhinitis: Secondary | ICD-10-CM | POA: Diagnosis not present

## 2022-08-16 DIAGNOSIS — J3081 Allergic rhinitis due to animal (cat) (dog) hair and dander: Secondary | ICD-10-CM | POA: Diagnosis not present

## 2022-08-23 DIAGNOSIS — J3089 Other allergic rhinitis: Secondary | ICD-10-CM | POA: Diagnosis not present

## 2022-08-23 DIAGNOSIS — J301 Allergic rhinitis due to pollen: Secondary | ICD-10-CM | POA: Diagnosis not present

## 2022-08-31 DIAGNOSIS — J3089 Other allergic rhinitis: Secondary | ICD-10-CM | POA: Diagnosis not present

## 2022-08-31 DIAGNOSIS — J3081 Allergic rhinitis due to animal (cat) (dog) hair and dander: Secondary | ICD-10-CM | POA: Diagnosis not present

## 2022-08-31 DIAGNOSIS — J301 Allergic rhinitis due to pollen: Secondary | ICD-10-CM | POA: Diagnosis not present

## 2022-08-31 DIAGNOSIS — R053 Chronic cough: Secondary | ICD-10-CM | POA: Diagnosis not present

## 2022-09-03 DIAGNOSIS — J3089 Other allergic rhinitis: Secondary | ICD-10-CM | POA: Diagnosis not present

## 2022-09-07 DIAGNOSIS — J3089 Other allergic rhinitis: Secondary | ICD-10-CM | POA: Diagnosis not present

## 2022-09-07 DIAGNOSIS — J301 Allergic rhinitis due to pollen: Secondary | ICD-10-CM | POA: Diagnosis not present

## 2022-09-07 DIAGNOSIS — J3081 Allergic rhinitis due to animal (cat) (dog) hair and dander: Secondary | ICD-10-CM | POA: Diagnosis not present

## 2022-09-14 DIAGNOSIS — H5203 Hypermetropia, bilateral: Secondary | ICD-10-CM | POA: Diagnosis not present

## 2022-09-14 DIAGNOSIS — J301 Allergic rhinitis due to pollen: Secondary | ICD-10-CM | POA: Diagnosis not present

## 2022-09-14 DIAGNOSIS — J3089 Other allergic rhinitis: Secondary | ICD-10-CM | POA: Diagnosis not present

## 2022-09-14 DIAGNOSIS — J3081 Allergic rhinitis due to animal (cat) (dog) hair and dander: Secondary | ICD-10-CM | POA: Diagnosis not present

## 2022-09-21 DIAGNOSIS — J3081 Allergic rhinitis due to animal (cat) (dog) hair and dander: Secondary | ICD-10-CM | POA: Diagnosis not present

## 2022-09-21 DIAGNOSIS — J3089 Other allergic rhinitis: Secondary | ICD-10-CM | POA: Diagnosis not present

## 2022-09-21 DIAGNOSIS — J301 Allergic rhinitis due to pollen: Secondary | ICD-10-CM | POA: Diagnosis not present

## 2022-09-24 DIAGNOSIS — J3089 Other allergic rhinitis: Secondary | ICD-10-CM | POA: Diagnosis not present

## 2022-09-28 DIAGNOSIS — J3089 Other allergic rhinitis: Secondary | ICD-10-CM | POA: Diagnosis not present

## 2022-10-04 DIAGNOSIS — J301 Allergic rhinitis due to pollen: Secondary | ICD-10-CM | POA: Diagnosis not present

## 2022-10-04 DIAGNOSIS — J3081 Allergic rhinitis due to animal (cat) (dog) hair and dander: Secondary | ICD-10-CM | POA: Diagnosis not present

## 2022-10-04 DIAGNOSIS — J3089 Other allergic rhinitis: Secondary | ICD-10-CM | POA: Diagnosis not present

## 2022-10-08 DIAGNOSIS — J3081 Allergic rhinitis due to animal (cat) (dog) hair and dander: Secondary | ICD-10-CM | POA: Diagnosis not present

## 2022-10-08 DIAGNOSIS — J3089 Other allergic rhinitis: Secondary | ICD-10-CM | POA: Diagnosis not present

## 2022-10-08 DIAGNOSIS — J301 Allergic rhinitis due to pollen: Secondary | ICD-10-CM | POA: Diagnosis not present

## 2022-10-12 DIAGNOSIS — J3089 Other allergic rhinitis: Secondary | ICD-10-CM | POA: Diagnosis not present

## 2022-10-12 DIAGNOSIS — J3081 Allergic rhinitis due to animal (cat) (dog) hair and dander: Secondary | ICD-10-CM | POA: Diagnosis not present

## 2022-10-12 DIAGNOSIS — J301 Allergic rhinitis due to pollen: Secondary | ICD-10-CM | POA: Diagnosis not present

## 2022-10-14 DIAGNOSIS — J3089 Other allergic rhinitis: Secondary | ICD-10-CM | POA: Diagnosis not present

## 2022-10-21 DIAGNOSIS — J3089 Other allergic rhinitis: Secondary | ICD-10-CM | POA: Diagnosis not present

## 2022-10-25 DIAGNOSIS — J3089 Other allergic rhinitis: Secondary | ICD-10-CM | POA: Diagnosis not present

## 2022-10-25 DIAGNOSIS — J301 Allergic rhinitis due to pollen: Secondary | ICD-10-CM | POA: Diagnosis not present

## 2022-10-25 DIAGNOSIS — J3081 Allergic rhinitis due to animal (cat) (dog) hair and dander: Secondary | ICD-10-CM | POA: Diagnosis not present

## 2022-10-26 DIAGNOSIS — R69 Illness, unspecified: Secondary | ICD-10-CM | POA: Diagnosis not present

## 2022-11-01 DIAGNOSIS — J3081 Allergic rhinitis due to animal (cat) (dog) hair and dander: Secondary | ICD-10-CM | POA: Diagnosis not present

## 2022-11-01 DIAGNOSIS — J301 Allergic rhinitis due to pollen: Secondary | ICD-10-CM | POA: Diagnosis not present

## 2022-11-01 DIAGNOSIS — J3089 Other allergic rhinitis: Secondary | ICD-10-CM | POA: Diagnosis not present

## 2022-11-04 DIAGNOSIS — J3089 Other allergic rhinitis: Secondary | ICD-10-CM | POA: Diagnosis not present

## 2022-11-08 DIAGNOSIS — J3081 Allergic rhinitis due to animal (cat) (dog) hair and dander: Secondary | ICD-10-CM | POA: Diagnosis not present

## 2022-11-08 DIAGNOSIS — J3089 Other allergic rhinitis: Secondary | ICD-10-CM | POA: Diagnosis not present

## 2022-11-08 DIAGNOSIS — J301 Allergic rhinitis due to pollen: Secondary | ICD-10-CM | POA: Diagnosis not present

## 2022-11-15 DIAGNOSIS — J3089 Other allergic rhinitis: Secondary | ICD-10-CM | POA: Diagnosis not present

## 2022-11-15 DIAGNOSIS — L821 Other seborrheic keratosis: Secondary | ICD-10-CM | POA: Diagnosis not present

## 2022-11-15 DIAGNOSIS — L570 Actinic keratosis: Secondary | ICD-10-CM | POA: Diagnosis not present

## 2022-11-15 DIAGNOSIS — L820 Inflamed seborrheic keratosis: Secondary | ICD-10-CM | POA: Diagnosis not present

## 2022-11-15 DIAGNOSIS — D2272 Melanocytic nevi of left lower limb, including hip: Secondary | ICD-10-CM | POA: Diagnosis not present

## 2022-11-15 DIAGNOSIS — D2362 Other benign neoplasm of skin of left upper limb, including shoulder: Secondary | ICD-10-CM | POA: Diagnosis not present

## 2022-11-15 DIAGNOSIS — J3081 Allergic rhinitis due to animal (cat) (dog) hair and dander: Secondary | ICD-10-CM | POA: Diagnosis not present

## 2022-11-15 DIAGNOSIS — J301 Allergic rhinitis due to pollen: Secondary | ICD-10-CM | POA: Diagnosis not present

## 2022-11-15 DIAGNOSIS — L814 Other melanin hyperpigmentation: Secondary | ICD-10-CM | POA: Diagnosis not present

## 2022-11-18 DIAGNOSIS — J301 Allergic rhinitis due to pollen: Secondary | ICD-10-CM | POA: Diagnosis not present

## 2022-11-18 DIAGNOSIS — J3089 Other allergic rhinitis: Secondary | ICD-10-CM | POA: Diagnosis not present

## 2022-11-18 DIAGNOSIS — J3081 Allergic rhinitis due to animal (cat) (dog) hair and dander: Secondary | ICD-10-CM | POA: Diagnosis not present

## 2022-11-26 DIAGNOSIS — J301 Allergic rhinitis due to pollen: Secondary | ICD-10-CM | POA: Diagnosis not present

## 2022-11-26 DIAGNOSIS — J3089 Other allergic rhinitis: Secondary | ICD-10-CM | POA: Diagnosis not present

## 2022-11-26 DIAGNOSIS — J3081 Allergic rhinitis due to animal (cat) (dog) hair and dander: Secondary | ICD-10-CM | POA: Diagnosis not present

## 2022-11-30 DIAGNOSIS — J3089 Other allergic rhinitis: Secondary | ICD-10-CM | POA: Diagnosis not present

## 2022-12-07 DIAGNOSIS — J3089 Other allergic rhinitis: Secondary | ICD-10-CM | POA: Diagnosis not present

## 2022-12-07 DIAGNOSIS — J301 Allergic rhinitis due to pollen: Secondary | ICD-10-CM | POA: Diagnosis not present

## 2022-12-07 DIAGNOSIS — J3081 Allergic rhinitis due to animal (cat) (dog) hair and dander: Secondary | ICD-10-CM | POA: Diagnosis not present

## 2022-12-14 DIAGNOSIS — J3081 Allergic rhinitis due to animal (cat) (dog) hair and dander: Secondary | ICD-10-CM | POA: Diagnosis not present

## 2022-12-14 DIAGNOSIS — J301 Allergic rhinitis due to pollen: Secondary | ICD-10-CM | POA: Diagnosis not present

## 2022-12-14 DIAGNOSIS — J3089 Other allergic rhinitis: Secondary | ICD-10-CM | POA: Diagnosis not present

## 2022-12-21 DIAGNOSIS — J3081 Allergic rhinitis due to animal (cat) (dog) hair and dander: Secondary | ICD-10-CM | POA: Diagnosis not present

## 2022-12-21 DIAGNOSIS — J3089 Other allergic rhinitis: Secondary | ICD-10-CM | POA: Diagnosis not present

## 2022-12-21 DIAGNOSIS — J301 Allergic rhinitis due to pollen: Secondary | ICD-10-CM | POA: Diagnosis not present

## 2022-12-28 DIAGNOSIS — J3081 Allergic rhinitis due to animal (cat) (dog) hair and dander: Secondary | ICD-10-CM | POA: Diagnosis not present

## 2022-12-28 DIAGNOSIS — J301 Allergic rhinitis due to pollen: Secondary | ICD-10-CM | POA: Diagnosis not present

## 2022-12-28 DIAGNOSIS — H6123 Impacted cerumen, bilateral: Secondary | ICD-10-CM | POA: Diagnosis not present

## 2022-12-28 DIAGNOSIS — J3089 Other allergic rhinitis: Secondary | ICD-10-CM | POA: Diagnosis not present

## 2023-01-04 DIAGNOSIS — J301 Allergic rhinitis due to pollen: Secondary | ICD-10-CM | POA: Diagnosis not present

## 2023-01-04 DIAGNOSIS — J3089 Other allergic rhinitis: Secondary | ICD-10-CM | POA: Diagnosis not present

## 2023-01-04 DIAGNOSIS — J3081 Allergic rhinitis due to animal (cat) (dog) hair and dander: Secondary | ICD-10-CM | POA: Diagnosis not present

## 2023-01-12 DIAGNOSIS — J301 Allergic rhinitis due to pollen: Secondary | ICD-10-CM | POA: Diagnosis not present

## 2023-01-12 DIAGNOSIS — J3081 Allergic rhinitis due to animal (cat) (dog) hair and dander: Secondary | ICD-10-CM | POA: Diagnosis not present

## 2023-01-12 DIAGNOSIS — J3089 Other allergic rhinitis: Secondary | ICD-10-CM | POA: Diagnosis not present

## 2023-01-18 DIAGNOSIS — J3089 Other allergic rhinitis: Secondary | ICD-10-CM | POA: Diagnosis not present

## 2023-01-18 DIAGNOSIS — J301 Allergic rhinitis due to pollen: Secondary | ICD-10-CM | POA: Diagnosis not present

## 2023-01-18 DIAGNOSIS — J3081 Allergic rhinitis due to animal (cat) (dog) hair and dander: Secondary | ICD-10-CM | POA: Diagnosis not present

## 2023-01-21 DIAGNOSIS — Z6821 Body mass index (BMI) 21.0-21.9, adult: Secondary | ICD-10-CM | POA: Diagnosis not present

## 2023-01-21 DIAGNOSIS — I10 Essential (primary) hypertension: Secondary | ICD-10-CM | POA: Diagnosis not present

## 2023-01-21 DIAGNOSIS — F419 Anxiety disorder, unspecified: Secondary | ICD-10-CM | POA: Diagnosis not present

## 2023-01-21 DIAGNOSIS — M8588 Other specified disorders of bone density and structure, other site: Secondary | ICD-10-CM | POA: Diagnosis not present

## 2023-01-26 DIAGNOSIS — J3089 Other allergic rhinitis: Secondary | ICD-10-CM | POA: Diagnosis not present

## 2023-02-01 DIAGNOSIS — J301 Allergic rhinitis due to pollen: Secondary | ICD-10-CM | POA: Diagnosis not present

## 2023-02-01 DIAGNOSIS — J3089 Other allergic rhinitis: Secondary | ICD-10-CM | POA: Diagnosis not present

## 2023-02-01 DIAGNOSIS — J3081 Allergic rhinitis due to animal (cat) (dog) hair and dander: Secondary | ICD-10-CM | POA: Diagnosis not present

## 2023-02-02 DIAGNOSIS — J3089 Other allergic rhinitis: Secondary | ICD-10-CM | POA: Diagnosis not present

## 2023-02-09 DIAGNOSIS — J3089 Other allergic rhinitis: Secondary | ICD-10-CM | POA: Diagnosis not present

## 2023-02-14 DIAGNOSIS — J3081 Allergic rhinitis due to animal (cat) (dog) hair and dander: Secondary | ICD-10-CM | POA: Diagnosis not present

## 2023-02-14 DIAGNOSIS — J301 Allergic rhinitis due to pollen: Secondary | ICD-10-CM | POA: Diagnosis not present

## 2023-02-14 DIAGNOSIS — J3089 Other allergic rhinitis: Secondary | ICD-10-CM | POA: Diagnosis not present

## 2023-02-21 DIAGNOSIS — J301 Allergic rhinitis due to pollen: Secondary | ICD-10-CM | POA: Diagnosis not present

## 2023-02-21 DIAGNOSIS — J3089 Other allergic rhinitis: Secondary | ICD-10-CM | POA: Diagnosis not present

## 2023-02-21 DIAGNOSIS — J3081 Allergic rhinitis due to animal (cat) (dog) hair and dander: Secondary | ICD-10-CM | POA: Diagnosis not present

## 2023-02-28 DIAGNOSIS — J301 Allergic rhinitis due to pollen: Secondary | ICD-10-CM | POA: Diagnosis not present

## 2023-02-28 DIAGNOSIS — J3089 Other allergic rhinitis: Secondary | ICD-10-CM | POA: Diagnosis not present

## 2023-02-28 DIAGNOSIS — J3081 Allergic rhinitis due to animal (cat) (dog) hair and dander: Secondary | ICD-10-CM | POA: Diagnosis not present

## 2023-03-07 DIAGNOSIS — J3089 Other allergic rhinitis: Secondary | ICD-10-CM | POA: Diagnosis not present

## 2023-03-07 DIAGNOSIS — J3081 Allergic rhinitis due to animal (cat) (dog) hair and dander: Secondary | ICD-10-CM | POA: Diagnosis not present

## 2023-03-07 DIAGNOSIS — J301 Allergic rhinitis due to pollen: Secondary | ICD-10-CM | POA: Diagnosis not present

## 2023-03-14 DIAGNOSIS — J3089 Other allergic rhinitis: Secondary | ICD-10-CM | POA: Diagnosis not present

## 2023-03-14 DIAGNOSIS — J3081 Allergic rhinitis due to animal (cat) (dog) hair and dander: Secondary | ICD-10-CM | POA: Diagnosis not present

## 2023-03-14 DIAGNOSIS — J301 Allergic rhinitis due to pollen: Secondary | ICD-10-CM | POA: Diagnosis not present

## 2023-03-21 DIAGNOSIS — J3089 Other allergic rhinitis: Secondary | ICD-10-CM | POA: Diagnosis not present

## 2023-03-21 DIAGNOSIS — J3081 Allergic rhinitis due to animal (cat) (dog) hair and dander: Secondary | ICD-10-CM | POA: Diagnosis not present

## 2023-03-21 DIAGNOSIS — J301 Allergic rhinitis due to pollen: Secondary | ICD-10-CM | POA: Diagnosis not present

## 2023-03-28 DIAGNOSIS — J3081 Allergic rhinitis due to animal (cat) (dog) hair and dander: Secondary | ICD-10-CM | POA: Diagnosis not present

## 2023-03-28 DIAGNOSIS — J3089 Other allergic rhinitis: Secondary | ICD-10-CM | POA: Diagnosis not present

## 2023-03-28 DIAGNOSIS — J301 Allergic rhinitis due to pollen: Secondary | ICD-10-CM | POA: Diagnosis not present

## 2023-04-04 DIAGNOSIS — J301 Allergic rhinitis due to pollen: Secondary | ICD-10-CM | POA: Diagnosis not present

## 2023-04-04 DIAGNOSIS — J3081 Allergic rhinitis due to animal (cat) (dog) hair and dander: Secondary | ICD-10-CM | POA: Diagnosis not present

## 2023-04-04 DIAGNOSIS — J3089 Other allergic rhinitis: Secondary | ICD-10-CM | POA: Diagnosis not present

## 2023-04-11 DIAGNOSIS — J3089 Other allergic rhinitis: Secondary | ICD-10-CM | POA: Diagnosis not present

## 2023-04-11 DIAGNOSIS — J3081 Allergic rhinitis due to animal (cat) (dog) hair and dander: Secondary | ICD-10-CM | POA: Diagnosis not present

## 2023-04-11 DIAGNOSIS — J301 Allergic rhinitis due to pollen: Secondary | ICD-10-CM | POA: Diagnosis not present

## 2023-04-18 DIAGNOSIS — J3081 Allergic rhinitis due to animal (cat) (dog) hair and dander: Secondary | ICD-10-CM | POA: Diagnosis not present

## 2023-04-18 DIAGNOSIS — J301 Allergic rhinitis due to pollen: Secondary | ICD-10-CM | POA: Diagnosis not present

## 2023-04-18 DIAGNOSIS — J3089 Other allergic rhinitis: Secondary | ICD-10-CM | POA: Diagnosis not present

## 2023-04-20 DIAGNOSIS — M25561 Pain in right knee: Secondary | ICD-10-CM | POA: Diagnosis not present

## 2023-04-25 DIAGNOSIS — R053 Chronic cough: Secondary | ICD-10-CM | POA: Diagnosis not present

## 2023-04-25 DIAGNOSIS — J3081 Allergic rhinitis due to animal (cat) (dog) hair and dander: Secondary | ICD-10-CM | POA: Diagnosis not present

## 2023-04-25 DIAGNOSIS — J301 Allergic rhinitis due to pollen: Secondary | ICD-10-CM | POA: Diagnosis not present

## 2023-04-25 DIAGNOSIS — J3089 Other allergic rhinitis: Secondary | ICD-10-CM | POA: Diagnosis not present

## 2023-05-02 DIAGNOSIS — J301 Allergic rhinitis due to pollen: Secondary | ICD-10-CM | POA: Diagnosis not present

## 2023-05-02 DIAGNOSIS — J3089 Other allergic rhinitis: Secondary | ICD-10-CM | POA: Diagnosis not present

## 2023-05-02 DIAGNOSIS — J3081 Allergic rhinitis due to animal (cat) (dog) hair and dander: Secondary | ICD-10-CM | POA: Diagnosis not present

## 2023-05-02 DIAGNOSIS — M25561 Pain in right knee: Secondary | ICD-10-CM | POA: Diagnosis not present

## 2023-05-08 DIAGNOSIS — M25561 Pain in right knee: Secondary | ICD-10-CM | POA: Diagnosis not present

## 2023-05-10 DIAGNOSIS — J3081 Allergic rhinitis due to animal (cat) (dog) hair and dander: Secondary | ICD-10-CM | POA: Diagnosis not present

## 2023-05-10 DIAGNOSIS — J301 Allergic rhinitis due to pollen: Secondary | ICD-10-CM | POA: Diagnosis not present

## 2023-05-10 DIAGNOSIS — J3089 Other allergic rhinitis: Secondary | ICD-10-CM | POA: Diagnosis not present

## 2023-05-13 DIAGNOSIS — M1711 Unilateral primary osteoarthritis, right knee: Secondary | ICD-10-CM | POA: Diagnosis not present

## 2023-05-13 DIAGNOSIS — M25561 Pain in right knee: Secondary | ICD-10-CM | POA: Diagnosis not present

## 2023-05-16 DIAGNOSIS — J3089 Other allergic rhinitis: Secondary | ICD-10-CM | POA: Diagnosis not present

## 2023-05-16 DIAGNOSIS — J301 Allergic rhinitis due to pollen: Secondary | ICD-10-CM | POA: Diagnosis not present

## 2023-05-16 DIAGNOSIS — J3081 Allergic rhinitis due to animal (cat) (dog) hair and dander: Secondary | ICD-10-CM | POA: Diagnosis not present

## 2023-05-23 DIAGNOSIS — J3081 Allergic rhinitis due to animal (cat) (dog) hair and dander: Secondary | ICD-10-CM | POA: Diagnosis not present

## 2023-05-23 DIAGNOSIS — J301 Allergic rhinitis due to pollen: Secondary | ICD-10-CM | POA: Diagnosis not present

## 2023-05-23 DIAGNOSIS — J3089 Other allergic rhinitis: Secondary | ICD-10-CM | POA: Diagnosis not present

## 2023-05-30 DIAGNOSIS — J3089 Other allergic rhinitis: Secondary | ICD-10-CM | POA: Diagnosis not present

## 2023-05-30 DIAGNOSIS — J301 Allergic rhinitis due to pollen: Secondary | ICD-10-CM | POA: Diagnosis not present

## 2023-05-30 DIAGNOSIS — J3081 Allergic rhinitis due to animal (cat) (dog) hair and dander: Secondary | ICD-10-CM | POA: Diagnosis not present

## 2023-06-06 DIAGNOSIS — J301 Allergic rhinitis due to pollen: Secondary | ICD-10-CM | POA: Diagnosis not present

## 2023-06-06 DIAGNOSIS — J3089 Other allergic rhinitis: Secondary | ICD-10-CM | POA: Diagnosis not present

## 2023-06-06 DIAGNOSIS — J3081 Allergic rhinitis due to animal (cat) (dog) hair and dander: Secondary | ICD-10-CM | POA: Diagnosis not present

## 2023-06-13 DIAGNOSIS — J3081 Allergic rhinitis due to animal (cat) (dog) hair and dander: Secondary | ICD-10-CM | POA: Diagnosis not present

## 2023-06-13 DIAGNOSIS — J3089 Other allergic rhinitis: Secondary | ICD-10-CM | POA: Diagnosis not present

## 2023-06-13 DIAGNOSIS — J301 Allergic rhinitis due to pollen: Secondary | ICD-10-CM | POA: Diagnosis not present

## 2023-06-20 DIAGNOSIS — J3089 Other allergic rhinitis: Secondary | ICD-10-CM | POA: Diagnosis not present

## 2023-06-20 DIAGNOSIS — J301 Allergic rhinitis due to pollen: Secondary | ICD-10-CM | POA: Diagnosis not present

## 2023-06-20 DIAGNOSIS — J3081 Allergic rhinitis due to animal (cat) (dog) hair and dander: Secondary | ICD-10-CM | POA: Diagnosis not present

## 2023-06-23 DIAGNOSIS — M8588 Other specified disorders of bone density and structure, other site: Secondary | ICD-10-CM | POA: Diagnosis not present

## 2023-06-23 DIAGNOSIS — Z1231 Encounter for screening mammogram for malignant neoplasm of breast: Secondary | ICD-10-CM | POA: Diagnosis not present

## 2023-06-23 DIAGNOSIS — Z8262 Family history of osteoporosis: Secondary | ICD-10-CM | POA: Diagnosis not present

## 2023-06-24 DIAGNOSIS — M25561 Pain in right knee: Secondary | ICD-10-CM | POA: Diagnosis not present

## 2023-06-27 DIAGNOSIS — J301 Allergic rhinitis due to pollen: Secondary | ICD-10-CM | POA: Diagnosis not present

## 2023-06-27 DIAGNOSIS — J3089 Other allergic rhinitis: Secondary | ICD-10-CM | POA: Diagnosis not present

## 2023-06-27 DIAGNOSIS — J3081 Allergic rhinitis due to animal (cat) (dog) hair and dander: Secondary | ICD-10-CM | POA: Diagnosis not present

## 2023-07-04 DIAGNOSIS — J3081 Allergic rhinitis due to animal (cat) (dog) hair and dander: Secondary | ICD-10-CM | POA: Diagnosis not present

## 2023-07-04 DIAGNOSIS — J301 Allergic rhinitis due to pollen: Secondary | ICD-10-CM | POA: Diagnosis not present

## 2023-07-04 DIAGNOSIS — J3089 Other allergic rhinitis: Secondary | ICD-10-CM | POA: Diagnosis not present

## 2023-07-11 DIAGNOSIS — J301 Allergic rhinitis due to pollen: Secondary | ICD-10-CM | POA: Diagnosis not present

## 2023-07-11 DIAGNOSIS — J3081 Allergic rhinitis due to animal (cat) (dog) hair and dander: Secondary | ICD-10-CM | POA: Diagnosis not present

## 2023-07-11 DIAGNOSIS — J3089 Other allergic rhinitis: Secondary | ICD-10-CM | POA: Diagnosis not present

## 2023-07-13 DIAGNOSIS — J3089 Other allergic rhinitis: Secondary | ICD-10-CM | POA: Diagnosis not present

## 2023-07-18 DIAGNOSIS — J301 Allergic rhinitis due to pollen: Secondary | ICD-10-CM | POA: Diagnosis not present

## 2023-07-18 DIAGNOSIS — J3089 Other allergic rhinitis: Secondary | ICD-10-CM | POA: Diagnosis not present

## 2023-07-18 DIAGNOSIS — J3081 Allergic rhinitis due to animal (cat) (dog) hair and dander: Secondary | ICD-10-CM | POA: Diagnosis not present

## 2023-07-21 DIAGNOSIS — E559 Vitamin D deficiency, unspecified: Secondary | ICD-10-CM | POA: Diagnosis not present

## 2023-07-21 DIAGNOSIS — I10 Essential (primary) hypertension: Secondary | ICD-10-CM | POA: Diagnosis not present

## 2023-07-25 DIAGNOSIS — J3081 Allergic rhinitis due to animal (cat) (dog) hair and dander: Secondary | ICD-10-CM | POA: Diagnosis not present

## 2023-07-25 DIAGNOSIS — J301 Allergic rhinitis due to pollen: Secondary | ICD-10-CM | POA: Diagnosis not present

## 2023-07-25 DIAGNOSIS — J3089 Other allergic rhinitis: Secondary | ICD-10-CM | POA: Diagnosis not present

## 2023-08-01 DIAGNOSIS — E559 Vitamin D deficiency, unspecified: Secondary | ICD-10-CM | POA: Diagnosis not present

## 2023-08-01 DIAGNOSIS — Z6822 Body mass index (BMI) 22.0-22.9, adult: Secondary | ICD-10-CM | POA: Diagnosis not present

## 2023-08-01 DIAGNOSIS — I10 Essential (primary) hypertension: Secondary | ICD-10-CM | POA: Diagnosis not present

## 2023-08-01 DIAGNOSIS — Z9181 History of falling: Secondary | ICD-10-CM | POA: Diagnosis not present

## 2023-08-01 DIAGNOSIS — J3081 Allergic rhinitis due to animal (cat) (dog) hair and dander: Secondary | ICD-10-CM | POA: Diagnosis not present

## 2023-08-01 DIAGNOSIS — J3089 Other allergic rhinitis: Secondary | ICD-10-CM | POA: Diagnosis not present

## 2023-08-01 DIAGNOSIS — R079 Chest pain, unspecified: Secondary | ICD-10-CM | POA: Diagnosis not present

## 2023-08-01 DIAGNOSIS — R0609 Other forms of dyspnea: Secondary | ICD-10-CM | POA: Diagnosis not present

## 2023-08-01 DIAGNOSIS — J301 Allergic rhinitis due to pollen: Secondary | ICD-10-CM | POA: Diagnosis not present

## 2023-08-01 DIAGNOSIS — F419 Anxiety disorder, unspecified: Secondary | ICD-10-CM | POA: Diagnosis not present

## 2023-08-01 DIAGNOSIS — Z0000 Encounter for general adult medical examination without abnormal findings: Secondary | ICD-10-CM | POA: Diagnosis not present

## 2023-08-08 DIAGNOSIS — J3089 Other allergic rhinitis: Secondary | ICD-10-CM | POA: Diagnosis not present

## 2023-08-08 DIAGNOSIS — J301 Allergic rhinitis due to pollen: Secondary | ICD-10-CM | POA: Diagnosis not present

## 2023-08-08 DIAGNOSIS — J3081 Allergic rhinitis due to animal (cat) (dog) hair and dander: Secondary | ICD-10-CM | POA: Diagnosis not present

## 2023-08-15 DIAGNOSIS — J3081 Allergic rhinitis due to animal (cat) (dog) hair and dander: Secondary | ICD-10-CM | POA: Diagnosis not present

## 2023-08-15 DIAGNOSIS — J3089 Other allergic rhinitis: Secondary | ICD-10-CM | POA: Diagnosis not present

## 2023-08-15 DIAGNOSIS — J301 Allergic rhinitis due to pollen: Secondary | ICD-10-CM | POA: Diagnosis not present

## 2023-08-22 DIAGNOSIS — J3089 Other allergic rhinitis: Secondary | ICD-10-CM | POA: Diagnosis not present

## 2023-08-22 DIAGNOSIS — J301 Allergic rhinitis due to pollen: Secondary | ICD-10-CM | POA: Diagnosis not present

## 2023-08-22 DIAGNOSIS — J3081 Allergic rhinitis due to animal (cat) (dog) hair and dander: Secondary | ICD-10-CM | POA: Diagnosis not present

## 2023-08-29 DIAGNOSIS — J3081 Allergic rhinitis due to animal (cat) (dog) hair and dander: Secondary | ICD-10-CM | POA: Diagnosis not present

## 2023-08-29 DIAGNOSIS — J301 Allergic rhinitis due to pollen: Secondary | ICD-10-CM | POA: Diagnosis not present

## 2023-08-29 DIAGNOSIS — J3089 Other allergic rhinitis: Secondary | ICD-10-CM | POA: Diagnosis not present

## 2023-09-05 DIAGNOSIS — J3089 Other allergic rhinitis: Secondary | ICD-10-CM | POA: Diagnosis not present

## 2023-09-05 DIAGNOSIS — J3081 Allergic rhinitis due to animal (cat) (dog) hair and dander: Secondary | ICD-10-CM | POA: Diagnosis not present

## 2023-09-05 DIAGNOSIS — J301 Allergic rhinitis due to pollen: Secondary | ICD-10-CM | POA: Diagnosis not present

## 2023-09-14 DIAGNOSIS — J3089 Other allergic rhinitis: Secondary | ICD-10-CM | POA: Diagnosis not present

## 2023-09-15 DIAGNOSIS — H5203 Hypermetropia, bilateral: Secondary | ICD-10-CM | POA: Diagnosis not present

## 2023-09-21 DIAGNOSIS — J3089 Other allergic rhinitis: Secondary | ICD-10-CM | POA: Diagnosis not present

## 2023-09-28 DIAGNOSIS — J3089 Other allergic rhinitis: Secondary | ICD-10-CM | POA: Diagnosis not present

## 2023-09-28 DIAGNOSIS — J3081 Allergic rhinitis due to animal (cat) (dog) hair and dander: Secondary | ICD-10-CM | POA: Diagnosis not present

## 2023-09-28 DIAGNOSIS — J301 Allergic rhinitis due to pollen: Secondary | ICD-10-CM | POA: Diagnosis not present

## 2023-10-05 DIAGNOSIS — J3089 Other allergic rhinitis: Secondary | ICD-10-CM | POA: Diagnosis not present

## 2023-10-12 DIAGNOSIS — J3081 Allergic rhinitis due to animal (cat) (dog) hair and dander: Secondary | ICD-10-CM | POA: Diagnosis not present

## 2023-10-12 DIAGNOSIS — J3089 Other allergic rhinitis: Secondary | ICD-10-CM | POA: Diagnosis not present

## 2023-10-12 DIAGNOSIS — J301 Allergic rhinitis due to pollen: Secondary | ICD-10-CM | POA: Diagnosis not present

## 2023-10-17 DIAGNOSIS — J301 Allergic rhinitis due to pollen: Secondary | ICD-10-CM | POA: Diagnosis not present

## 2023-10-17 DIAGNOSIS — J3081 Allergic rhinitis due to animal (cat) (dog) hair and dander: Secondary | ICD-10-CM | POA: Diagnosis not present

## 2023-10-17 DIAGNOSIS — J3089 Other allergic rhinitis: Secondary | ICD-10-CM | POA: Diagnosis not present

## 2023-10-24 DIAGNOSIS — J3089 Other allergic rhinitis: Secondary | ICD-10-CM | POA: Diagnosis not present

## 2023-10-24 DIAGNOSIS — J301 Allergic rhinitis due to pollen: Secondary | ICD-10-CM | POA: Diagnosis not present

## 2023-10-24 DIAGNOSIS — J3081 Allergic rhinitis due to animal (cat) (dog) hair and dander: Secondary | ICD-10-CM | POA: Diagnosis not present

## 2023-10-31 DIAGNOSIS — J3089 Other allergic rhinitis: Secondary | ICD-10-CM | POA: Diagnosis not present

## 2023-10-31 DIAGNOSIS — J301 Allergic rhinitis due to pollen: Secondary | ICD-10-CM | POA: Diagnosis not present

## 2023-10-31 DIAGNOSIS — J3081 Allergic rhinitis due to animal (cat) (dog) hair and dander: Secondary | ICD-10-CM | POA: Diagnosis not present

## 2023-11-07 DIAGNOSIS — M25521 Pain in right elbow: Secondary | ICD-10-CM | POA: Diagnosis not present

## 2023-11-07 DIAGNOSIS — J3081 Allergic rhinitis due to animal (cat) (dog) hair and dander: Secondary | ICD-10-CM | POA: Diagnosis not present

## 2023-11-07 DIAGNOSIS — J301 Allergic rhinitis due to pollen: Secondary | ICD-10-CM | POA: Diagnosis not present

## 2023-11-07 DIAGNOSIS — J3089 Other allergic rhinitis: Secondary | ICD-10-CM | POA: Diagnosis not present

## 2023-11-07 DIAGNOSIS — M7711 Lateral epicondylitis, right elbow: Secondary | ICD-10-CM | POA: Diagnosis not present

## 2023-11-14 DIAGNOSIS — J3089 Other allergic rhinitis: Secondary | ICD-10-CM | POA: Diagnosis not present

## 2023-11-14 DIAGNOSIS — J301 Allergic rhinitis due to pollen: Secondary | ICD-10-CM | POA: Diagnosis not present

## 2023-11-14 DIAGNOSIS — J3081 Allergic rhinitis due to animal (cat) (dog) hair and dander: Secondary | ICD-10-CM | POA: Diagnosis not present

## 2023-11-21 DIAGNOSIS — J301 Allergic rhinitis due to pollen: Secondary | ICD-10-CM | POA: Diagnosis not present

## 2023-11-21 DIAGNOSIS — J3081 Allergic rhinitis due to animal (cat) (dog) hair and dander: Secondary | ICD-10-CM | POA: Diagnosis not present

## 2023-11-21 DIAGNOSIS — J3089 Other allergic rhinitis: Secondary | ICD-10-CM | POA: Diagnosis not present

## 2023-11-21 DIAGNOSIS — L821 Other seborrheic keratosis: Secondary | ICD-10-CM | POA: Diagnosis not present

## 2023-11-21 DIAGNOSIS — L718 Other rosacea: Secondary | ICD-10-CM | POA: Diagnosis not present

## 2023-11-21 DIAGNOSIS — D1801 Hemangioma of skin and subcutaneous tissue: Secondary | ICD-10-CM | POA: Diagnosis not present

## 2023-11-21 DIAGNOSIS — L814 Other melanin hyperpigmentation: Secondary | ICD-10-CM | POA: Diagnosis not present

## 2023-11-21 DIAGNOSIS — D2362 Other benign neoplasm of skin of left upper limb, including shoulder: Secondary | ICD-10-CM | POA: Diagnosis not present

## 2023-11-21 DIAGNOSIS — D2261 Melanocytic nevi of right upper limb, including shoulder: Secondary | ICD-10-CM | POA: Diagnosis not present

## 2023-11-22 DIAGNOSIS — Z6822 Body mass index (BMI) 22.0-22.9, adult: Secondary | ICD-10-CM | POA: Diagnosis not present

## 2023-11-22 DIAGNOSIS — H6122 Impacted cerumen, left ear: Secondary | ICD-10-CM | POA: Diagnosis not present

## 2023-11-28 DIAGNOSIS — J3089 Other allergic rhinitis: Secondary | ICD-10-CM | POA: Diagnosis not present

## 2023-11-28 DIAGNOSIS — J301 Allergic rhinitis due to pollen: Secondary | ICD-10-CM | POA: Diagnosis not present

## 2023-11-28 DIAGNOSIS — J3081 Allergic rhinitis due to animal (cat) (dog) hair and dander: Secondary | ICD-10-CM | POA: Diagnosis not present

## 2023-12-05 DIAGNOSIS — J3081 Allergic rhinitis due to animal (cat) (dog) hair and dander: Secondary | ICD-10-CM | POA: Diagnosis not present

## 2023-12-05 DIAGNOSIS — J3089 Other allergic rhinitis: Secondary | ICD-10-CM | POA: Diagnosis not present

## 2023-12-05 DIAGNOSIS — J301 Allergic rhinitis due to pollen: Secondary | ICD-10-CM | POA: Diagnosis not present

## 2023-12-12 DIAGNOSIS — J3089 Other allergic rhinitis: Secondary | ICD-10-CM | POA: Diagnosis not present

## 2023-12-12 DIAGNOSIS — J3081 Allergic rhinitis due to animal (cat) (dog) hair and dander: Secondary | ICD-10-CM | POA: Diagnosis not present

## 2023-12-12 DIAGNOSIS — J301 Allergic rhinitis due to pollen: Secondary | ICD-10-CM | POA: Diagnosis not present

## 2023-12-19 DIAGNOSIS — J301 Allergic rhinitis due to pollen: Secondary | ICD-10-CM | POA: Diagnosis not present

## 2023-12-19 DIAGNOSIS — J3081 Allergic rhinitis due to animal (cat) (dog) hair and dander: Secondary | ICD-10-CM | POA: Diagnosis not present

## 2023-12-19 DIAGNOSIS — J3089 Other allergic rhinitis: Secondary | ICD-10-CM | POA: Diagnosis not present

## 2023-12-20 DIAGNOSIS — H25043 Posterior subcapsular polar age-related cataract, bilateral: Secondary | ICD-10-CM | POA: Diagnosis not present

## 2023-12-20 DIAGNOSIS — H2512 Age-related nuclear cataract, left eye: Secondary | ICD-10-CM | POA: Diagnosis not present

## 2023-12-20 DIAGNOSIS — H18413 Arcus senilis, bilateral: Secondary | ICD-10-CM | POA: Diagnosis not present

## 2023-12-20 DIAGNOSIS — H25013 Cortical age-related cataract, bilateral: Secondary | ICD-10-CM | POA: Diagnosis not present

## 2023-12-20 DIAGNOSIS — H2513 Age-related nuclear cataract, bilateral: Secondary | ICD-10-CM | POA: Diagnosis not present

## 2023-12-26 DIAGNOSIS — J301 Allergic rhinitis due to pollen: Secondary | ICD-10-CM | POA: Diagnosis not present

## 2023-12-26 DIAGNOSIS — J3089 Other allergic rhinitis: Secondary | ICD-10-CM | POA: Diagnosis not present

## 2023-12-26 DIAGNOSIS — J3081 Allergic rhinitis due to animal (cat) (dog) hair and dander: Secondary | ICD-10-CM | POA: Diagnosis not present

## 2023-12-27 NOTE — Progress Notes (Signed)
 Referring-Charles Cherly Corners, MD Reason for referral-chest pain  HPI: 73 year old female for evaluation of chest pain at request of Jimmey Mould, MD.  Pt seen previously but not since 2016. Stress echocardiogram 2017 normal.  Patient states that for the past 3 months she has had progressive dyspnea on exertion.  No orthopnea, PND, pedal edema or syncope.  She has not traveled recently.  She also has had occasional chest pain not associated with her dyspnea.  These are described as a sharp pain in the left chest area radiating to the left axilla.  Longest duration is 1 minute and occasional last seconds.  Cardiology now asked to evaluate.  Current Outpatient Medications  Medication Sig Dispense Refill   aspirin  81 MG chewable tablet Chew 81 mg by mouth daily.       Benazepril-Hydrochlorothiazide (LOTENSIN HCT PO) Take 1 tablet by mouth daily.     calcium-vitamin D (OSCAL WITH D) 500-200 MG-UNIT per tablet Take 1 tablet by mouth daily. Reported on 09/02/2015     clonazePAM (KLONOPIN) 0.5 MG tablet Take 0.5 mg by mouth at bedtime as needed for anxiety.      methocarbamol  (ROBAXIN ) 500 MG tablet Take 1 tablet (500 mg total) by mouth 2 (two) times daily. 20 tablet 0   nortriptyline (PAMELOR) 50 MG capsule Take 50 mg by mouth at bedtime.       No current facility-administered medications for this visit.    Allergies  Allergen Reactions   Erythromycin Other (See Comments)    Sharp pain in stomach   Fluogen [Influenza Virus Vaccine]    Fosamax [Alendronate]     NAUSEA AND VOMITING    Nsaids    Prolia [Denosumab]     DENTAL PROBLEM     Past Medical History:  Diagnosis Date   Allergic rhinitis    Anxiety    Arthritis    Dry eye    Fatty liver    Hemorrhoids    Hypertension    Osteoarthritis    Osteopenia    Panic attacks    Vitamin D deficiency     Past Surgical History:  Procedure Laterality Date   CHOLECYSTECTOMY  09/06/2006   COLONOSCOPY WITH  ESOPHAGOGASTRODUODENOSCOPY (EGD)     ROTATOR CUFF REPAIR     TONSILLECTOMY     TOOTH IMPLANT     TOTAL ABDOMINAL HYSTERECTOMY W/ BILATERAL SALPINGOOPHORECTOMY  09/06/2006    Social History   Socioeconomic History   Marital status: Married    Spouse name: Not on file   Number of children: 2   Years of education: Not on file   Highest education level: Not on file  Occupational History   Not on file  Tobacco Use   Smoking status: Never   Smokeless tobacco: Never  Substance and Sexual Activity   Alcohol use: No    Alcohol/week: 0.0 standard drinks of alcohol   Drug use: No   Sexual activity: Not on file  Other Topics Concern   Not on file  Social History Narrative   Not on file   Social Drivers of Health   Financial Resource Strain: Not on file  Food Insecurity: Low Risk  (12/28/2022)   Received from [facility]   Hunger Vital Sign    Worried About Running Out of Food in the Last Year: Never true    Ran Out of Food in the Last Year: Never true  Transportation Needs: Not on file (12/28/2022)  Physical Activity: Not on file  Stress: Not on file  Social Connections: Not on file  Intimate Partner Violence: Not on file    Family History  Problem Relation Age of Onset   Heart disease Mother    Diabetes Mother    Hypertension Mother    Hyperlipidemia Mother    Heart attack Father    Hypertension Father    Hyperlipidemia Father    CAD Father        MI at age 82   Stroke Father    Hyperlipidemia Sister    Hypertension Sister    Hyperlipidemia Sister    Hypertension Sister     ROS: no fevers or chills, productive cough, hemoptysis, dysphasia, odynophagia, melena, hematochezia, dysuria, hematuria, rash, seizure activity, orthopnea, PND, pedal edema, claudication. Remaining systems are negative.  Physical Exam:   Blood pressure (!) 100/58, pulse 86, height 5' (1.524 m), weight 118 lb 3.2 oz (53.6 kg), SpO2 98%.  General:  Well developed/well nourished in NAD Skin  warm/dry Patient not depressed No peripheral clubbing Back-normal HEENT-normal/normal eyelids Neck supple/normal carotid upstroke bilaterally; no bruits; no JVD; no thyromegaly chest - CTA/ normal expansion CV - RRR/normal S1 and S2; no murmurs, rubs or gallops;  PMI nondisplaced Abdomen -NT/ND, no HSM, no mass, + bowel sounds, no bruit 2+ femoral pulses, no bruits Ext-no edema, chords, 2+ DP Neuro-grossly nonfocal  EKG Interpretation Date/Time:  Tuesday Jan 10 2024 08:24:08 EDT Ventricular Rate:  68 PR Interval:  134 QRS Duration:  96 QT Interval:  412 QTC Calculation: 438 R Axis:   63  Text Interpretation: Normal sinus rhythm Nonspecific ST and T wave abnormality Confirmed by Alexandria Angel ([id number]) on 01/10/2024 8:25:16 AM    A/P  1 chest pain-symptoms are atypical.  However strong family history of coronary artery disease.  Will arrange coronary CTA to rule out obstructive coronary disease.  2 hypertension-blood pressure is controlled.  Continue present medical regimen.  3 dyspnea-patient describes dyspnea on exertion as well.  We will arrange an echocardiogram to assess LV function.  Alexandria Angel, MD

## 2023-12-28 DIAGNOSIS — H00012 Hordeolum externum right lower eyelid: Secondary | ICD-10-CM | POA: Diagnosis not present

## 2023-12-28 DIAGNOSIS — H00022 Hordeolum internum right lower eyelid: Secondary | ICD-10-CM | POA: Diagnosis not present

## 2024-01-02 DIAGNOSIS — J3089 Other allergic rhinitis: Secondary | ICD-10-CM | POA: Diagnosis not present

## 2024-01-09 DIAGNOSIS — J3081 Allergic rhinitis due to animal (cat) (dog) hair and dander: Secondary | ICD-10-CM | POA: Diagnosis not present

## 2024-01-09 DIAGNOSIS — J3089 Other allergic rhinitis: Secondary | ICD-10-CM | POA: Diagnosis not present

## 2024-01-10 ENCOUNTER — Encounter: Payer: Self-pay | Admitting: Cardiology

## 2024-01-10 ENCOUNTER — Ambulatory Visit: Payer: Medicare HMO | Attending: Cardiology | Admitting: Cardiology

## 2024-01-10 VITALS — BP 100/58 | HR 86 | Ht 60.0 in | Wt 118.2 lb

## 2024-01-10 DIAGNOSIS — R072 Precordial pain: Secondary | ICD-10-CM

## 2024-01-10 DIAGNOSIS — R0609 Other forms of dyspnea: Secondary | ICD-10-CM

## 2024-01-10 DIAGNOSIS — I10 Essential (primary) hypertension: Secondary | ICD-10-CM

## 2024-01-10 MED ORDER — IVABRADINE HCL 5 MG PO TABS: ORAL_TABLET | ORAL | 0 refills | Status: AC

## 2024-01-10 NOTE — Patient Instructions (Signed)
 Testing/Procedures:   Your physician has requested that you have an echocardiogram. Echocardiography is a painless test that uses sound waves to create images of your heart. It provides your doctor with information about the size and shape of your heart and how well your heart's chambers and valves are working. This procedure takes approximately one hour. There are no restrictions for this procedure. Please do NOT wear cologne, perfume, aftershave, or lotions (deodorant is allowed). Please arrive 15 minutes prior to your appointment time.  Please note: We ask at that you not bring children with you during ultrasound (echo/ vascular) testing. Due to room size and safety concerns, children are not allowed in the ultrasound rooms during exams. Our front office staff cannot provide observation of children in our lobby area while testing is being conducted. An adult accompanying a patient to their appointment will only be allowed in the ultrasound room at the discretion of the ultrasound technician under special circumstances. We apologize for any inconvenience. [address]     Your cardiac CT will be scheduled at    [facility] and Vascular Tower [address]  [place], [place] [address] Opening January 02, 2024   If scheduled at the Heart and Vascular Tower at [employer], please enter the parking lot using the [address] street entrance and use the FREE valet service at the patient drop-off area. Enter the buidling and check-in with registration on the main floor.  Please follow these instructions carefully (unless otherwise directed):  An IV will be required for this test and Nitroglycerin will be given.     On the Night Before the Test: Be sure to Drink plenty of water. Do not consume any caffeinated/decaffeinated beverages or chocolate 12 hours prior to your test. Do not take any antihistamines 12 hours prior to your test.   On the Day of the Test: Drink plenty  of water until 1 hour prior to the test. Do not eat any food 1 hour prior to test. You may take your regular medications prior to the test.  TAKE IVABRADINE 15 MG 2 HOURS PRIOR TO CT SCAN FEMALES- please wear underwire-free bra if available, avoid dresses & tight clothing        After the Test: Drink plenty of water. After receiving IV contrast, you may experience a mild flushed feeling. This is normal. On occasion, you may experience a mild rash up to 24 hours after the test. This is not dangerous. If this occurs, you can take Benadryl 25 mg, Zyrtec, Claritin, or Allegra and increase your fluid intake. (Patients taking Tikosyn should avoid Benadryl, and may take Zyrtec, Claritin, or Allegra) If you experience trouble breathing, this can be serious. If it is severe call 911 IMMEDIATELY. If it is mild, please call our office.  We will call to schedule your test 2-4 weeks out understanding that some insurance companies will need an authorization prior to the service being performed.   For more information and frequently asked questions, please visit our website : [email or web address]  For non-scheduling related questions, please contact the cardiac imaging nurse navigator should you have any questions/concerns: Cardiac Imaging Nurse Navigators Direct Office Dial: [phone]   For scheduling needs, including cancellations and rescheduling, please call [place], [phone].   Follow-Up: At [facility], you and your health needs are our priority.  As part of our continuing mission to provide you with exceptional heart care, our providers are all part of one team.  This team includes your  primary Cardiologist (physician) and Advanced Practice Providers or APPs (Physician Assistants and Nurse Practitioners) who all work together to provide you with the care you need, when you need it.  Your next appointment:   6 month(s)  Provider:   Alexandria Angel MD   We recommend  signing up for the patient portal called "MyChart".  Sign up information is provided on this After Visit Summary.  MyChart is used to connect with patients for Virtual Visits (Telemedicine).  Patients are able to view lab/test results, encounter notes, upcoming appointments, etc.  Non-urgent messages can be sent to your provider as well.   To learn more about what you can do with MyChart, go to [email or web address].

## 2024-01-13 DIAGNOSIS — J3089 Other allergic rhinitis: Secondary | ICD-10-CM | POA: Diagnosis not present

## 2024-01-16 DIAGNOSIS — J3081 Allergic rhinitis due to animal (cat) (dog) hair and dander: Secondary | ICD-10-CM | POA: Diagnosis not present

## 2024-01-16 DIAGNOSIS — J301 Allergic rhinitis due to pollen: Secondary | ICD-10-CM | POA: Diagnosis not present

## 2024-01-16 DIAGNOSIS — J3089 Other allergic rhinitis: Secondary | ICD-10-CM | POA: Diagnosis not present

## 2024-01-23 DIAGNOSIS — J301 Allergic rhinitis due to pollen: Secondary | ICD-10-CM | POA: Diagnosis not present

## 2024-01-23 DIAGNOSIS — J3089 Other allergic rhinitis: Secondary | ICD-10-CM | POA: Diagnosis not present

## 2024-01-23 DIAGNOSIS — J3081 Allergic rhinitis due to animal (cat) (dog) hair and dander: Secondary | ICD-10-CM | POA: Diagnosis not present

## 2024-01-24 ENCOUNTER — Encounter (HOSPITAL_COMMUNITY): Payer: Self-pay

## 2024-01-25 ENCOUNTER — Telehealth (HOSPITAL_COMMUNITY): Payer: Self-pay | Admitting: *Deleted

## 2024-01-25 ENCOUNTER — Telehealth (HOSPITAL_COMMUNITY): Payer: Self-pay | Admitting: Emergency Medicine

## 2024-01-25 NOTE — Telephone Encounter (Signed)
 Attempted to call patient regarding upcoming cardiac CT appointment. Left message on voicemail with name and callback number Johney Frame RN Navigator Cardiac Imaging [facility] [phone] Office

## 2024-01-25 NOTE — Telephone Encounter (Signed)
 Reaching out to patient to offer assistance regarding upcoming cardiac imaging study; pt verbalizes understanding of appt date/time, parking situation and where to check in, pre-test NPO status and medications ordered, and verified current allergies; name and call back number provided for further questions should they arise Jinger Mount RN Navigator Cardiac Imaging [facility] [phone] office [phone] cell  [place] 

## 2024-01-26 ENCOUNTER — Ambulatory Visit (HOSPITAL_COMMUNITY)
Admission: RE | Admit: 2024-01-26 | Discharge: 2024-01-26 | Disposition: A | Discharge status: Home / Self Care | Source: Ambulatory Visit | Attending: Cardiology | Admitting: Cardiology

## 2024-01-26 DIAGNOSIS — I10 Essential (primary) hypertension: Secondary | ICD-10-CM | POA: Diagnosis not present

## 2024-01-26 DIAGNOSIS — R072 Precordial pain: Secondary | ICD-10-CM | POA: Diagnosis not present

## 2024-01-26 DIAGNOSIS — R0609 Other forms of dyspnea: Secondary | ICD-10-CM | POA: Diagnosis not present

## 2024-01-26 MED ORDER — IOHEXOL 350 MG/ML SOLN
100.0000 mL | Freq: Once | INTRAVENOUS | Status: AC | PRN
  Administered 2024-01-26: 100 mL via INTRAVENOUS

## 2024-01-26 MED ORDER — NITROGLYCERIN 0.4 MG SL SUBL
0.8000 mg | SUBLINGUAL_TABLET | Freq: Once | SUBLINGUAL | Status: AC
  Administered 2024-01-26: 0.8 mg via SUBLINGUAL

## 2024-01-28 ENCOUNTER — Ambulatory Visit: Payer: Self-pay | Admitting: Cardiology

## 2024-01-31 DIAGNOSIS — J3089 Other allergic rhinitis: Secondary | ICD-10-CM | POA: Diagnosis not present

## 2024-01-31 DIAGNOSIS — J301 Allergic rhinitis due to pollen: Secondary | ICD-10-CM | POA: Diagnosis not present

## 2024-01-31 DIAGNOSIS — J3081 Allergic rhinitis due to animal (cat) (dog) hair and dander: Secondary | ICD-10-CM | POA: Diagnosis not present

## 2024-02-02 DIAGNOSIS — Z6822 Body mass index (BMI) 22.0-22.9, adult: Secondary | ICD-10-CM | POA: Diagnosis not present

## 2024-02-02 DIAGNOSIS — R0609 Other forms of dyspnea: Secondary | ICD-10-CM | POA: Diagnosis not present

## 2024-02-02 DIAGNOSIS — F419 Anxiety disorder, unspecified: Secondary | ICD-10-CM | POA: Diagnosis not present

## 2024-02-06 DIAGNOSIS — H2511 Age-related nuclear cataract, right eye: Secondary | ICD-10-CM | POA: Diagnosis not present

## 2024-02-07 DIAGNOSIS — J3089 Other allergic rhinitis: Secondary | ICD-10-CM | POA: Diagnosis not present

## 2024-02-13 ENCOUNTER — Ambulatory Visit (HOSPITAL_COMMUNITY)
Admission: RE | Admit: 2024-02-13 | Discharge: 2024-02-13 | Disposition: A | Discharge status: Home / Self Care | Source: Ambulatory Visit | Attending: Cardiovascular Disease | Admitting: Cardiovascular Disease

## 2024-02-13 DIAGNOSIS — I10 Essential (primary) hypertension: Secondary | ICD-10-CM

## 2024-02-13 DIAGNOSIS — R072 Precordial pain: Secondary | ICD-10-CM

## 2024-02-13 DIAGNOSIS — R0609 Other forms of dyspnea: Secondary | ICD-10-CM

## 2024-02-13 LAB — ECHOCARDIOGRAM COMPLETE
AR max vel: 1.92 cm2
AV Area VTI: 1.95 cm2
AV Area mean vel: 1.82 cm2
AV Mean grad: 3 mmHg
AV Peak grad: 7.7 mmHg
Ao pk vel: 1.39 m/s
Area-P 1/2: 3.34 cm2
Calc EF: 56.8 %
S' Lateral: 3.9 cm
Single Plane A2C EF: 57.2 %
Single Plane A4C EF: 55.2 %

## 2024-02-13 NOTE — Progress Notes (Signed)
*  PRELIMINARY RESULTS* Echocardiogram 2D Echocardiogram has been performed.  Jane Moody 02/13/2024, 3:49 PM

## 2024-02-14 DIAGNOSIS — J301 Allergic rhinitis due to pollen: Secondary | ICD-10-CM | POA: Diagnosis not present

## 2024-02-14 DIAGNOSIS — J3081 Allergic rhinitis due to animal (cat) (dog) hair and dander: Secondary | ICD-10-CM | POA: Diagnosis not present

## 2024-02-14 DIAGNOSIS — J3089 Other allergic rhinitis: Secondary | ICD-10-CM | POA: Diagnosis not present

## 2024-02-21 DIAGNOSIS — J301 Allergic rhinitis due to pollen: Secondary | ICD-10-CM | POA: Diagnosis not present

## 2024-02-21 DIAGNOSIS — J3089 Other allergic rhinitis: Secondary | ICD-10-CM | POA: Diagnosis not present

## 2024-02-21 DIAGNOSIS — J3081 Allergic rhinitis due to animal (cat) (dog) hair and dander: Secondary | ICD-10-CM | POA: Diagnosis not present

## 2024-02-28 DIAGNOSIS — J3081 Allergic rhinitis due to animal (cat) (dog) hair and dander: Secondary | ICD-10-CM | POA: Diagnosis not present

## 2024-02-28 DIAGNOSIS — J3089 Other allergic rhinitis: Secondary | ICD-10-CM | POA: Diagnosis not present

## 2024-02-28 DIAGNOSIS — J301 Allergic rhinitis due to pollen: Secondary | ICD-10-CM | POA: Diagnosis not present

## 2024-03-06 DIAGNOSIS — J3081 Allergic rhinitis due to animal (cat) (dog) hair and dander: Secondary | ICD-10-CM | POA: Diagnosis not present

## 2024-03-06 DIAGNOSIS — J301 Allergic rhinitis due to pollen: Secondary | ICD-10-CM | POA: Diagnosis not present

## 2024-03-06 DIAGNOSIS — J3089 Other allergic rhinitis: Secondary | ICD-10-CM | POA: Diagnosis not present

## 2024-03-13 DIAGNOSIS — J3081 Allergic rhinitis due to animal (cat) (dog) hair and dander: Secondary | ICD-10-CM | POA: Diagnosis not present

## 2024-03-13 DIAGNOSIS — J301 Allergic rhinitis due to pollen: Secondary | ICD-10-CM | POA: Diagnosis not present

## 2024-03-13 DIAGNOSIS — H25042 Posterior subcapsular polar age-related cataract, left eye: Secondary | ICD-10-CM | POA: Diagnosis not present

## 2024-03-13 DIAGNOSIS — H268 Other specified cataract: Secondary | ICD-10-CM | POA: Diagnosis not present

## 2024-03-13 DIAGNOSIS — H25012 Cortical age-related cataract, left eye: Secondary | ICD-10-CM | POA: Diagnosis not present

## 2024-03-13 DIAGNOSIS — H2512 Age-related nuclear cataract, left eye: Secondary | ICD-10-CM | POA: Diagnosis not present

## 2024-03-13 DIAGNOSIS — H2511 Age-related nuclear cataract, right eye: Secondary | ICD-10-CM | POA: Diagnosis not present

## 2024-03-13 DIAGNOSIS — I10 Essential (primary) hypertension: Secondary | ICD-10-CM | POA: Diagnosis not present

## 2024-03-13 DIAGNOSIS — J3089 Other allergic rhinitis: Secondary | ICD-10-CM | POA: Diagnosis not present

## 2024-03-20 DIAGNOSIS — J3081 Allergic rhinitis due to animal (cat) (dog) hair and dander: Secondary | ICD-10-CM | POA: Diagnosis not present

## 2024-03-20 DIAGNOSIS — J301 Allergic rhinitis due to pollen: Secondary | ICD-10-CM | POA: Diagnosis not present

## 2024-03-20 DIAGNOSIS — J3089 Other allergic rhinitis: Secondary | ICD-10-CM | POA: Diagnosis not present

## 2024-03-27 DIAGNOSIS — I10 Essential (primary) hypertension: Secondary | ICD-10-CM | POA: Diagnosis not present

## 2024-03-27 DIAGNOSIS — J3089 Other allergic rhinitis: Secondary | ICD-10-CM | POA: Diagnosis not present

## 2024-03-27 DIAGNOSIS — F419 Anxiety disorder, unspecified: Secondary | ICD-10-CM | POA: Diagnosis not present

## 2024-03-27 DIAGNOSIS — H268 Other specified cataract: Secondary | ICD-10-CM | POA: Diagnosis not present

## 2024-03-27 DIAGNOSIS — H2512 Age-related nuclear cataract, left eye: Secondary | ICD-10-CM | POA: Diagnosis not present

## 2024-04-03 DIAGNOSIS — J3081 Allergic rhinitis due to animal (cat) (dog) hair and dander: Secondary | ICD-10-CM | POA: Diagnosis not present

## 2024-04-03 DIAGNOSIS — J3089 Other allergic rhinitis: Secondary | ICD-10-CM | POA: Diagnosis not present

## 2024-04-03 DIAGNOSIS — J301 Allergic rhinitis due to pollen: Secondary | ICD-10-CM | POA: Diagnosis not present

## 2024-04-10 DIAGNOSIS — J3089 Other allergic rhinitis: Secondary | ICD-10-CM | POA: Diagnosis not present

## 2024-04-10 DIAGNOSIS — J301 Allergic rhinitis due to pollen: Secondary | ICD-10-CM | POA: Diagnosis not present

## 2024-04-10 DIAGNOSIS — J3081 Allergic rhinitis due to animal (cat) (dog) hair and dander: Secondary | ICD-10-CM | POA: Diagnosis not present

## 2024-04-17 DIAGNOSIS — J3089 Other allergic rhinitis: Secondary | ICD-10-CM | POA: Diagnosis not present

## 2024-04-18 DIAGNOSIS — J3089 Other allergic rhinitis: Secondary | ICD-10-CM | POA: Diagnosis not present

## 2024-04-18 DIAGNOSIS — J300 Vasomotor rhinitis: Secondary | ICD-10-CM | POA: Diagnosis not present

## 2024-04-18 DIAGNOSIS — J4520 Mild intermittent asthma, uncomplicated: Secondary | ICD-10-CM | POA: Diagnosis not present

## 2024-04-18 DIAGNOSIS — J301 Allergic rhinitis due to pollen: Secondary | ICD-10-CM | POA: Diagnosis not present

## 2024-04-24 DIAGNOSIS — J301 Allergic rhinitis due to pollen: Secondary | ICD-10-CM | POA: Diagnosis not present

## 2024-04-24 DIAGNOSIS — J3089 Other allergic rhinitis: Secondary | ICD-10-CM | POA: Diagnosis not present

## 2024-04-24 DIAGNOSIS — J3081 Allergic rhinitis due to animal (cat) (dog) hair and dander: Secondary | ICD-10-CM | POA: Diagnosis not present

## 2024-05-01 DIAGNOSIS — J3081 Allergic rhinitis due to animal (cat) (dog) hair and dander: Secondary | ICD-10-CM | POA: Diagnosis not present

## 2024-05-01 DIAGNOSIS — J301 Allergic rhinitis due to pollen: Secondary | ICD-10-CM | POA: Diagnosis not present

## 2024-05-01 DIAGNOSIS — J3089 Other allergic rhinitis: Secondary | ICD-10-CM | POA: Diagnosis not present

## 2024-05-08 DIAGNOSIS — J3089 Other allergic rhinitis: Secondary | ICD-10-CM | POA: Diagnosis not present

## 2024-05-11 DIAGNOSIS — M545 Low back pain, unspecified: Secondary | ICD-10-CM | POA: Diagnosis not present

## 2024-05-15 DIAGNOSIS — J3089 Other allergic rhinitis: Secondary | ICD-10-CM | POA: Diagnosis not present

## 2024-05-22 DIAGNOSIS — J3089 Other allergic rhinitis: Secondary | ICD-10-CM | POA: Diagnosis not present

## 2024-05-22 DIAGNOSIS — J301 Allergic rhinitis due to pollen: Secondary | ICD-10-CM | POA: Diagnosis not present

## 2024-05-22 DIAGNOSIS — J3081 Allergic rhinitis due to animal (cat) (dog) hair and dander: Secondary | ICD-10-CM | POA: Diagnosis not present

## 2024-05-29 DIAGNOSIS — J301 Allergic rhinitis due to pollen: Secondary | ICD-10-CM | POA: Diagnosis not present

## 2024-05-29 DIAGNOSIS — J3089 Other allergic rhinitis: Secondary | ICD-10-CM | POA: Diagnosis not present

## 2024-05-29 DIAGNOSIS — J3081 Allergic rhinitis due to animal (cat) (dog) hair and dander: Secondary | ICD-10-CM | POA: Diagnosis not present

## 2024-06-05 DIAGNOSIS — J3081 Allergic rhinitis due to animal (cat) (dog) hair and dander: Secondary | ICD-10-CM | POA: Diagnosis not present

## 2024-06-05 DIAGNOSIS — J3089 Other allergic rhinitis: Secondary | ICD-10-CM | POA: Diagnosis not present

## 2024-06-05 DIAGNOSIS — J301 Allergic rhinitis due to pollen: Secondary | ICD-10-CM | POA: Diagnosis not present

## 2024-06-12 DIAGNOSIS — J3089 Other allergic rhinitis: Secondary | ICD-10-CM | POA: Diagnosis not present

## 2024-06-12 DIAGNOSIS — J3081 Allergic rhinitis due to animal (cat) (dog) hair and dander: Secondary | ICD-10-CM | POA: Diagnosis not present

## 2024-06-19 DIAGNOSIS — J301 Allergic rhinitis due to pollen: Secondary | ICD-10-CM | POA: Diagnosis not present

## 2024-06-19 DIAGNOSIS — J3081 Allergic rhinitis due to animal (cat) (dog) hair and dander: Secondary | ICD-10-CM | POA: Diagnosis not present

## 2024-06-19 DIAGNOSIS — J3089 Other allergic rhinitis: Secondary | ICD-10-CM | POA: Diagnosis not present

## 2024-06-21 DIAGNOSIS — M5416 Radiculopathy, lumbar region: Secondary | ICD-10-CM | POA: Diagnosis not present

## 2024-06-26 DIAGNOSIS — J3089 Other allergic rhinitis: Secondary | ICD-10-CM | POA: Diagnosis not present

## 2024-06-26 DIAGNOSIS — J3081 Allergic rhinitis due to animal (cat) (dog) hair and dander: Secondary | ICD-10-CM | POA: Diagnosis not present

## 2024-06-26 DIAGNOSIS — J301 Allergic rhinitis due to pollen: Secondary | ICD-10-CM | POA: Diagnosis not present

## 2024-06-28 DIAGNOSIS — J3089 Other allergic rhinitis: Secondary | ICD-10-CM | POA: Diagnosis not present

## 2024-06-28 DIAGNOSIS — Z1231 Encounter for screening mammogram for malignant neoplasm of breast: Secondary | ICD-10-CM | POA: Diagnosis not present

## 2024-06-29 DIAGNOSIS — M5416 Radiculopathy, lumbar region: Secondary | ICD-10-CM | POA: Diagnosis not present

## 2024-07-02 DIAGNOSIS — H26491 Other secondary cataract, right eye: Secondary | ICD-10-CM | POA: Diagnosis not present

## 2024-07-02 DIAGNOSIS — Z961 Presence of intraocular lens: Secondary | ICD-10-CM | POA: Diagnosis not present

## 2024-07-02 DIAGNOSIS — H18451 Nodular corneal degeneration, right eye: Secondary | ICD-10-CM | POA: Diagnosis not present

## 2024-07-02 DIAGNOSIS — H18413 Arcus senilis, bilateral: Secondary | ICD-10-CM | POA: Diagnosis not present

## 2024-07-03 DIAGNOSIS — J3089 Other allergic rhinitis: Secondary | ICD-10-CM | POA: Diagnosis not present

## 2024-07-03 DIAGNOSIS — M5416 Radiculopathy, lumbar region: Secondary | ICD-10-CM | POA: Diagnosis not present

## 2024-07-03 DIAGNOSIS — J3081 Allergic rhinitis due to animal (cat) (dog) hair and dander: Secondary | ICD-10-CM | POA: Diagnosis not present

## 2024-07-03 DIAGNOSIS — J301 Allergic rhinitis due to pollen: Secondary | ICD-10-CM | POA: Diagnosis not present

## 2024-07-10 DIAGNOSIS — J3089 Other allergic rhinitis: Secondary | ICD-10-CM | POA: Diagnosis not present

## 2024-07-11 DIAGNOSIS — M5416 Radiculopathy, lumbar region: Secondary | ICD-10-CM | POA: Diagnosis not present

## 2024-07-17 DIAGNOSIS — J3081 Allergic rhinitis due to animal (cat) (dog) hair and dander: Secondary | ICD-10-CM | POA: Diagnosis not present

## 2024-07-17 DIAGNOSIS — J3089 Other allergic rhinitis: Secondary | ICD-10-CM | POA: Diagnosis not present

## 2024-07-17 DIAGNOSIS — J301 Allergic rhinitis due to pollen: Secondary | ICD-10-CM | POA: Diagnosis not present

## 2024-07-18 DIAGNOSIS — M5416 Radiculopathy, lumbar region: Secondary | ICD-10-CM | POA: Diagnosis not present

## 2024-07-24 DIAGNOSIS — J301 Allergic rhinitis due to pollen: Secondary | ICD-10-CM | POA: Diagnosis not present

## 2024-07-24 DIAGNOSIS — J3081 Allergic rhinitis due to animal (cat) (dog) hair and dander: Secondary | ICD-10-CM | POA: Diagnosis not present

## 2024-07-24 DIAGNOSIS — J3089 Other allergic rhinitis: Secondary | ICD-10-CM | POA: Diagnosis not present

## 2024-07-25 DIAGNOSIS — M5416 Radiculopathy, lumbar region: Secondary | ICD-10-CM | POA: Diagnosis not present

## 2024-07-31 DIAGNOSIS — J3089 Other allergic rhinitis: Secondary | ICD-10-CM | POA: Diagnosis not present

## 2024-08-06 DIAGNOSIS — E559 Vitamin D deficiency, unspecified: Secondary | ICD-10-CM | POA: Diagnosis not present

## 2024-08-06 DIAGNOSIS — I10 Essential (primary) hypertension: Secondary | ICD-10-CM | POA: Diagnosis not present

## 2024-08-07 DIAGNOSIS — J3081 Allergic rhinitis due to animal (cat) (dog) hair and dander: Secondary | ICD-10-CM | POA: Diagnosis not present

## 2024-08-07 DIAGNOSIS — J3089 Other allergic rhinitis: Secondary | ICD-10-CM | POA: Diagnosis not present

## 2024-08-07 DIAGNOSIS — J301 Allergic rhinitis due to pollen: Secondary | ICD-10-CM | POA: Diagnosis not present

## 2024-08-08 DIAGNOSIS — M5416 Radiculopathy, lumbar region: Secondary | ICD-10-CM | POA: Diagnosis not present

## 2024-08-14 DIAGNOSIS — J3089 Other allergic rhinitis: Secondary | ICD-10-CM | POA: Diagnosis not present

## 2024-08-14 DIAGNOSIS — J3081 Allergic rhinitis due to animal (cat) (dog) hair and dander: Secondary | ICD-10-CM | POA: Diagnosis not present

## 2024-08-14 DIAGNOSIS — J301 Allergic rhinitis due to pollen: Secondary | ICD-10-CM | POA: Diagnosis not present

## 2024-08-15 DIAGNOSIS — M5416 Radiculopathy, lumbar region: Secondary | ICD-10-CM | POA: Diagnosis not present

## 2024-08-21 DIAGNOSIS — J3089 Other allergic rhinitis: Secondary | ICD-10-CM | POA: Diagnosis not present

## 2024-08-24 DIAGNOSIS — M5416 Radiculopathy, lumbar region: Secondary | ICD-10-CM | POA: Diagnosis not present

## 2024-08-28 DIAGNOSIS — J3081 Allergic rhinitis due to animal (cat) (dog) hair and dander: Secondary | ICD-10-CM | POA: Diagnosis not present

## 2024-08-28 DIAGNOSIS — J301 Allergic rhinitis due to pollen: Secondary | ICD-10-CM | POA: Diagnosis not present

## 2024-08-28 DIAGNOSIS — J3089 Other allergic rhinitis: Secondary | ICD-10-CM | POA: Diagnosis not present
